# Patient Record
Sex: Female | Born: 1995 | Race: White | Hispanic: No | Marital: Single | Smoking: Never smoker
Health system: Southern US, Community
[De-identification: ages and names within clinical notes are randomized; demographics above are authoritative.]

## PROBLEM LIST (undated history)

## (undated) DIAGNOSIS — N946 Dysmenorrhea, unspecified: Secondary | ICD-10-CM

## (undated) DIAGNOSIS — D682 Hereditary deficiency of other clotting factors: Secondary | ICD-10-CM

## (undated) DIAGNOSIS — L708 Other acne: Secondary | ICD-10-CM

## (undated) DIAGNOSIS — F329 Major depressive disorder, single episode, unspecified: Secondary | ICD-10-CM

## (undated) DIAGNOSIS — I824Y9 Acute embolism and thrombosis of unspecified deep veins of unspecified proximal lower extremity: Secondary | ICD-10-CM

## (undated) DIAGNOSIS — F32A Depression, unspecified: Secondary | ICD-10-CM

## (undated) DIAGNOSIS — N83209 Unspecified ovarian cyst, unspecified side: Secondary | ICD-10-CM

## (undated) DIAGNOSIS — D649 Anemia, unspecified: Secondary | ICD-10-CM

## (undated) HISTORY — DX: Depression, unspecified: F32.A

## (undated) HISTORY — DX: Unspecified ovarian cyst, unspecified side: N83.209

## (undated) HISTORY — DX: Acute embolism and thrombosis of unspecified deep veins of unspecified proximal lower extremity: I82.4Y9

## (undated) HISTORY — PX: TONSILLECTOMY: SUR1361

## (undated) HISTORY — DX: Dysmenorrhea, unspecified: N94.6

## (undated) HISTORY — DX: Anemia, unspecified: D64.9

## (undated) HISTORY — DX: Other acne: L70.8

## (undated) HISTORY — DX: Major depressive disorder, single episode, unspecified: F32.9

---
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
## 2013-01-04 ENCOUNTER — Ambulatory Visit: Payer: BC Managed Care – PPO | Admitting: Family

## 2013-01-04 DIAGNOSIS — I824Y9 Acute embolism and thrombosis of unspecified deep veins of unspecified proximal lower extremity: Secondary | ICD-10-CM

## 2013-01-07 ENCOUNTER — Telehealth: Payer: Self-pay | Admitting: Oncology

## 2013-01-07 NOTE — Telephone Encounter (Signed)
LVOM for pt to return call.  °

## 2013-01-08 ENCOUNTER — Encounter: Payer: BC Managed Care – PPO | Admitting: Family

## 2013-01-08 ENCOUNTER — Ambulatory Visit: Payer: BC Managed Care – PPO

## 2013-01-29 ENCOUNTER — Telehealth: Payer: Self-pay | Admitting: Family

## 2013-01-29 NOTE — Telephone Encounter (Signed)
Needs OV in the next week

## 2013-01-29 NOTE — Telephone Encounter (Signed)
Needs an OV within the next 1 week

## 2013-01-29 NOTE — Telephone Encounter (Signed)
Left message to advise pt to call office to schedule pt/inr appt

## 2013-01-31 ENCOUNTER — Ambulatory Visit (INDEPENDENT_AMBULATORY_CARE_PROVIDER_SITE_OTHER): Payer: BC Managed Care – PPO | Admitting: Family

## 2013-01-31 DIAGNOSIS — I824Y9 Acute embolism and thrombosis of unspecified deep veins of unspecified proximal lower extremity: Secondary | ICD-10-CM

## 2013-01-31 LAB — POCT INR: INR: 2.6

## 2013-01-31 MED ORDER — WARFARIN SODIUM 7.5 MG PO TABS: 7.5000 mg | ORAL_TABLET | Freq: Every day | ORAL | Status: DC

## 2013-01-31 NOTE — Patient Instructions (Signed)
Continue 1 1/2 tabs of Coumadin a day EXCEPT 1 tab on Sat and Sunday.   Anticoagulation Dose Instructions as of 01/31/2013     Belinda Long Tue Wed Thu Fri Sat   New Dose 7.5 mg 11.25 mg 11.25 mg 11.25 mg 11.25 mg 11.25 mg 7.5 mg    Description       Continue 1 1/2 tabs of Coumadin a day EXCEPT 1 tab on Sat and Sunday.

## 2013-02-04 ENCOUNTER — Other Ambulatory Visit: Payer: Self-pay

## 2013-02-04 MED ORDER — WARFARIN SODIUM 7.5 MG PO TABS: ORAL_TABLET | ORAL | Status: DC

## 2013-02-05 ENCOUNTER — Telehealth: Payer: Self-pay | Admitting: Family

## 2013-02-05 MED ORDER — BUPROPION HCL ER (XL) 300 MG PO TB24: 300.0000 mg | ORAL_TABLET | Freq: Every day | ORAL | Status: DC

## 2013-02-05 NOTE — Telephone Encounter (Signed)
Instructions clarified and generic ok to fill.  Wellbutrin also filled per mom's request

## 2013-02-05 NOTE — Telephone Encounter (Signed)
Pharm called for clarification on warfarin (COUMADIN) 7.5 MG tablet. Pt has been getting generic, pt script states "dispense as written". That means name brand. Is it OK to fill generic?  Pls call back [facility] : [id number]  Ref no [id number]

## 2013-02-22 ENCOUNTER — Ambulatory Visit (INDEPENDENT_AMBULATORY_CARE_PROVIDER_SITE_OTHER): Payer: BC Managed Care – PPO | Admitting: Internal Medicine

## 2013-02-22 ENCOUNTER — Encounter: Payer: Self-pay | Admitting: Internal Medicine

## 2013-02-22 VITALS — BP 108/72 | HR 86 | Temp 99.1°F | Wt 137.0 lb

## 2013-02-22 DIAGNOSIS — R590 Localized enlarged lymph nodes: Secondary | ICD-10-CM | POA: Insufficient documentation

## 2013-02-22 DIAGNOSIS — R599 Enlarged lymph nodes, unspecified: Secondary | ICD-10-CM

## 2013-02-22 NOTE — Patient Instructions (Signed)
This appears to be a reactive lymph gland that is not concerning. You had a pimple-like bump on your scalp.   Would use warm compresses  antibiotic ointment that a couple times a day. The lymph gland could get a little bit bigger before it gets smaller   Contact us if not improving in another week or 2 or getting more areas of concern

## 2013-02-22 NOTE — Progress Notes (Signed)
Chief Complaint  Patient presents with  . Lump behind left ear  . Headache    HPI: Patient comes in today for SDA for  new problem evaluation.  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 She's noticed a little bit of fatigue and a couple days of headaches that she feels probably just related to her college situation. However she noted over the last day of mildly tender lump behind her left ear her mom wanted it checked out. She has no fever sore throat cough systemic symptoms with this. There is a bump on her scalp.  She is currently on Accutane now for acne and does have dry skin patches and fissuring.  ROS: See pertinent positives and negatives per HPI. Feels it mood is doing well current medications and situation  Past Medical History  Diagnosis Date  . Acne   . Dysmenorrhea in the adolescent   . DVT (deep venous thrombosis)   . Factor V deficiency   . 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 DVT, lower extremity, proximal, acute 06/07/2012    No family history on file.  History   Social History  . Marital Status: Single    Spouse Name: N/A    Number of Children: N/A  . Years of Education: N/A   Social History Main Topics  . Smoking status: Never Smoker   . Smokeless tobacco: None  . Alcohol Use: No  . Drug Use: No  . Sexually Active: Yes    Birth Control/ Protection: None   Other Topics Concern  . None   Social History Narrative   Plays basketball and volleyball   [employer] going into 12th grade grades are good   HH of 3   3 dogs    Outpatient Encounter Prescriptions as of 02/22/2013  Medication Sig Dispense Refill  . buPROPion (WELLBUTRIN XL) 300 MG 24 hr tablet Take 1 tablet (300 mg total) by mouth daily.  90 tablet  0  . ibuprofen (ADVIL) 200 MG tablet Take 200 mg by mouth every 6 (six) hours as needed.      . ISOtretinoin (ACCUTANE) 30 MG capsule Take 30 mg by mouth 2 (two) times daily.       Marland Kitchen warfarin (COUMADIN) 7.5 MG tablet Take 1 1/2 tablets daily, except Friday and Saturday take 1 tablet  180 tablet  0  . [DISCONTINUED] MYORISAN 40 MG capsule        No facility-administered encounter medications on file as of 02/22/2013.    EXAM:  BP 108/72  Pulse 86  Temp(Src) 99.1 F (37.3 C) (Oral)  Wt 137 lb (62.143 kg)  BMI 21.45 kg/m2  SpO2 99%  LMP 01/25/2013  Body mass index is 21.45 kg/(m^2).  GENERAL: vitals reviewed and listed above, alert, oriented, appears well hydrated and in no acute distress  HEENT: atraumatic, conjunctiva  clear, no obvious abnormalities on inspection of external nose and ears lips are a bit dry and cracked skin is clear TMs are intact EACs are normal. There is some mild dry patches behind the ears.  There is a or centimeter red mildly tender bump with central excoriation on the left parietal scalp OP : no lesion edema or exudate   NECK: no obvious masses on inspection palpation no adenopathy  LUNGS:  No respiratory symptoms  CV: HRRR,   MS: moves all extremities normal gait  PSYCH: pleasant and  cooperative, no obvious depression or anxiety  ASSESSMENT AND PLAN:  Discussed the following assessment and plan:  Reactive l retroauricular  lymphadenopathy This appears to be benign reactive to the red area on the scalp local care expectant management at this time no oral antibiotic followup if worsening. -Patient advised to return or notify health care team  if symptoms worsen or persist or new concerns arise.  Patient Instructions  This appears to be a reactive lymph gland that is not concerning. You had a pimple-like bump on your scalp.   Would use warm compresses  antibiotic ointment that a couple times a day. The lymph gland could get a little bit bigger before it gets smaller   Contact us if not improving in another week or 2 or getting more areas of concern      Burna Mortimer K. Panosh M.D.

## 2013-03-01 ENCOUNTER — Encounter: Payer: BC Managed Care – PPO | Admitting: Family

## 2013-03-04 ENCOUNTER — Ambulatory Visit (INDEPENDENT_AMBULATORY_CARE_PROVIDER_SITE_OTHER): Payer: BC Managed Care – PPO | Admitting: Family

## 2013-03-04 DIAGNOSIS — I824Y9 Acute embolism and thrombosis of unspecified deep veins of unspecified proximal lower extremity: Secondary | ICD-10-CM

## 2013-03-04 LAB — POCT INR: INR: 1.3

## 2013-03-04 NOTE — Patient Instructions (Addendum)
Take an extra 1/2 tab today and tomorrow. Continue 1 1/2 tabs of Coumadin a day EXCEPT 1 tab on Sat and Sunday.   Anticoagulation Dose Instructions as of 03/04/2013     Belinda Long Tue Wed Thu Fri Sat   New Dose 7.5 mg 11.25 mg 11.25 mg 11.25 mg 11.25 mg 11.25 mg 7.5 mg    Description       Take an extra 1/2 tab today and tomorrow. Continue 1 1/2 tabs of Coumadin a day EXCEPT 1 tab on Sat and Sunday.

## 2013-03-18 ENCOUNTER — Encounter: Payer: Self-pay | Admitting: Internal Medicine

## 2013-03-18 ENCOUNTER — Ambulatory Visit (INDEPENDENT_AMBULATORY_CARE_PROVIDER_SITE_OTHER): Payer: BC Managed Care – PPO | Admitting: Internal Medicine

## 2013-03-18 VITALS — BP 112/78 | HR 84 | Temp 98.4°F | Wt 133.0 lb

## 2013-03-18 DIAGNOSIS — M79609 Pain in unspecified limb: Secondary | ICD-10-CM

## 2013-03-18 DIAGNOSIS — K130 Diseases of lips: Secondary | ICD-10-CM

## 2013-03-18 DIAGNOSIS — L0889 Other specified local infections of the skin and subcutaneous tissue: Secondary | ICD-10-CM

## 2013-03-18 DIAGNOSIS — M79605 Pain in left leg: Secondary | ICD-10-CM

## 2013-03-18 DIAGNOSIS — Z79899 Other long term (current) drug therapy: Secondary | ICD-10-CM

## 2013-03-18 MED ORDER — MUPIROCIN 2 % EX OINT: TOPICAL_OINTMENT | Freq: Three times a day (TID) | CUTANEOUS | Status: DC

## 2013-03-18 NOTE — Progress Notes (Signed)
Chief Complaint  Patient presents with  . Chapped lips    HPI: Patient comes in today for SDA for  new problem evaluation. Dry lips with accutane but now sore and cracked middle lower and corners  And given topical but derm but no helping and ? wat to do .   No fever mouth sores   Also mom wanted to have her report ocass lef leg pain bu t not like when had clot. No new swelling   INRs stated went low and then in range above 2 range per pt  No bleeding swelling   ROS: See pertinent positives and negatives per HPI.  Past Medical History  Diagnosis Date  . Acne   . Dysmenorrhea in the adolescent   . DVT (deep venous thrombosis)   . Factor V deficiency   . 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 DVT, lower extremity, proximal, acute 06/07/2012    No family history on file.  History   Social History  . Marital Status: Single    Spouse Name: N/A    Number of Children: N/A  . Years of Education: N/A   Social History Main Topics  . Smoking status: Never Smoker   . Smokeless tobacco: None  . Alcohol Use: No  . Drug Use: No  . Sexually Active: Yes    Birth Control/ Protection: None   Other Topics Concern  . None   Social History Narrative   Plays basketball and volleyball   [employer] going into 12th grade grades are good   HH of 3   3 dogs    Outpatient Encounter Prescriptions as of 03/18/2013  Medication Sig Dispense Refill  . buPROPion (WELLBUTRIN XL) 300 MG 24 hr tablet Take 1 tablet (300 mg total) by mouth daily.  90 tablet  0  . ibuprofen (ADVIL) 200 MG tablet Take 200 mg by mouth every 6 (six) hours as needed.      . ISOtretinoin (ACCUTANE) 30 MG capsule Take 30 mg by mouth 2 (two) times daily.      Marland Kitchen warfarin (COUMADIN) 7.5 MG tablet Take 1 1/2 tablets daily, except Friday and Saturday take 1 tablet  180 tablet  0  . mupirocin ointment (BACTROBAN) 2 % Apply topically 3 (three) times daily.  22 g  0    No facility-administered encounter medications on file as of 03/18/2013.    EXAM:  BP 112/78  Pulse 84  Temp(Src) 98.4 F (36.9 C) (Oral)  Wt 133 lb (60.328 kg)  BMI 20.83 kg/m2  SpO2 98%  LMP 02/21/2013  Body mass index is 20.83 kg/(m^2).  GENERAL: vitals reviewed and listed above, alert, oriented, appears well hydrated and in no acute distress  HEENT: atraumatic, conjunctiva  clear, no obvious abnormalities on inspection of external nose and ears OP : no lesion edema or exudate   lipds dry middle fissue and perleche at both corners and no vesicles or pustules or facial redness   NECK: no obvious masses on inspection palpation    CV: HRRR, no clubbing cyanosis or  peripheral edema nl cap refill   MS: moves all extremities without noticeable focal  Abnormality no gross problem with left leg  Gait ok   PSYCH: pleasant and cooperative, no obvious depression or anxiety  ASSESSMENT AND PLAN:  Discussed the following assessment and plan:  Perleche - rx with bactroban and anti yeast  prob accutane  set up  Ongoing accutane therapy  Lip fissure  Leg pain, left - doubt dvt recurrence make sure  at goal for inr and if  ongoing pain and orswelling then fu.   -Patient advised to return or notify health care team  if symptoms worsen or persist or new concerns arise.  Patient Instructions  Sometimes there can be overgrowth of bacteria and yeast in the cracks   expecially on the sisdes. These can ge helped but topical  Antibiotic such as bactroban and yeast  Such as monistat.    Twice  To 3 x per day    Try this for a week or so  Contact if not getting better.  Or your dematologist office   Use  moisurizer   frequntly also .   Neta Mends. Clarence Cogswell M.D.

## 2013-03-18 NOTE — Patient Instructions (Addendum)
Sometimes there can be overgrowth of bacteria and yeast in the cracks   expecially on the sisdes. These can ge helped but topical  Antibiotic such as bactroban and yeast  Such as monistat.    Twice  To 3 x per day    Try this for a week or so  Contact if not getting better.  Or your dematologist office   Use  moisurizer   frequntly also .

## 2013-03-21 DIAGNOSIS — K130 Diseases of lips: Secondary | ICD-10-CM | POA: Insufficient documentation

## 2013-03-25 ENCOUNTER — Ambulatory Visit (INDEPENDENT_AMBULATORY_CARE_PROVIDER_SITE_OTHER): Payer: BC Managed Care – PPO | Admitting: Family

## 2013-03-25 DIAGNOSIS — I824Y9 Acute embolism and thrombosis of unspecified deep veins of unspecified proximal lower extremity: Secondary | ICD-10-CM

## 2013-03-25 LAB — PROTIME-INR: INR: 3 — AB (ref <=1.1)

## 2013-03-25 NOTE — Patient Instructions (Addendum)
Continue 1 1/2 tabs of Coumadin a day EXCEPT 1 tab on Sat and Sunday  Anticoagulation Dose Instructions as of 03/25/2013     Belinda Long Tue Wed Thu Fri Sat   New Dose 7.5 mg 11.25 mg 11.25 mg 11.25 mg 11.25 mg 11.25 mg 7.5 mg    Description       Continue 1 1/2 tabs of Coumadin a day EXCEPT 1 tab on Sat and Sunday.

## 2013-04-18 IMAGING — CT CT ANGIO CHEST
2 of 6 series · 19 of 36 positions shown · IV contrast (OMNIPAQUE)
Comparison: No priors.

CLINICAL DATA: A large deep venous thrombosis in left leg extending
into the distal IVC.

CT ANGIOGRAPHY CHEST
TECHNIQUE: Multidetector CT imaging of the chest using the
standard protocol during bolus administration of intravenous
contrast. Multiplanar reconstructed images including MIPs were
obtained and reviewed to evaluate the vascular anatomy.
Contrast: 100mL OMNIPAQUE IOHEXOL 350 MG/ML SOLN

[Series 6: pe thins @ 1mm · axial · 0.75mm/px · z∈[-250,+0]mm · 18 of 278 slices shown]
[im 14/278  lung]
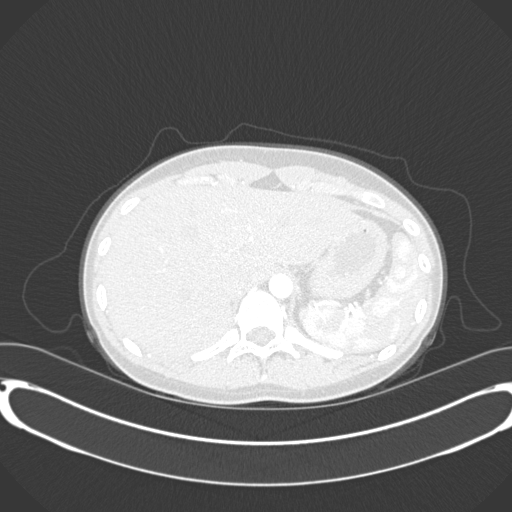
[im 28/278  mediastinal]
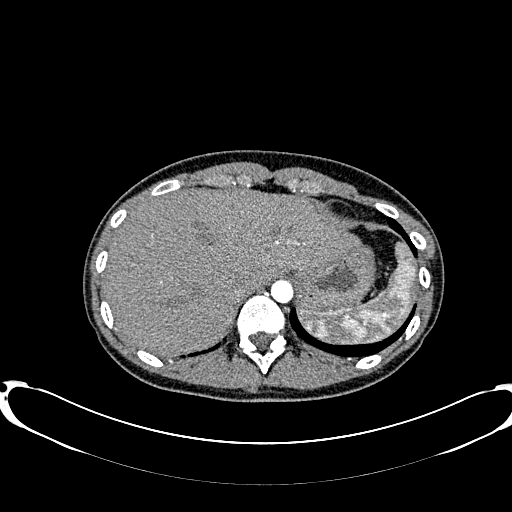
[im 42/278  lung]
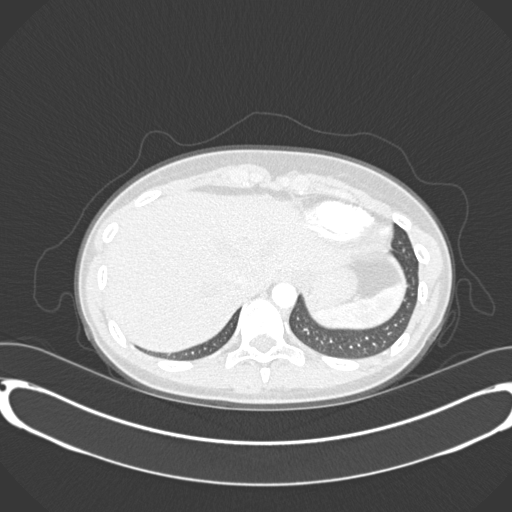
[im 56/278  mediastinal]
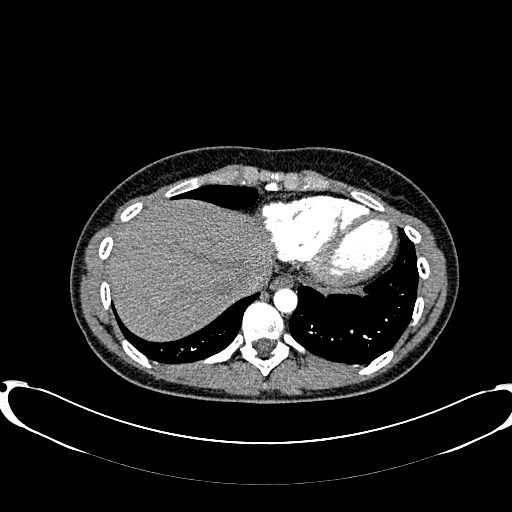
[im 70/278  lung]
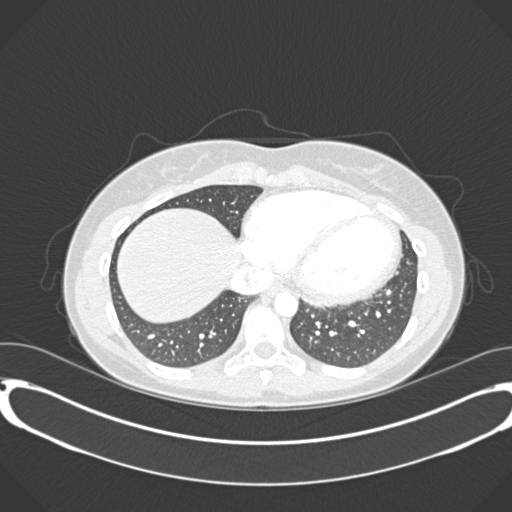
[im 84/278  mediastinal]
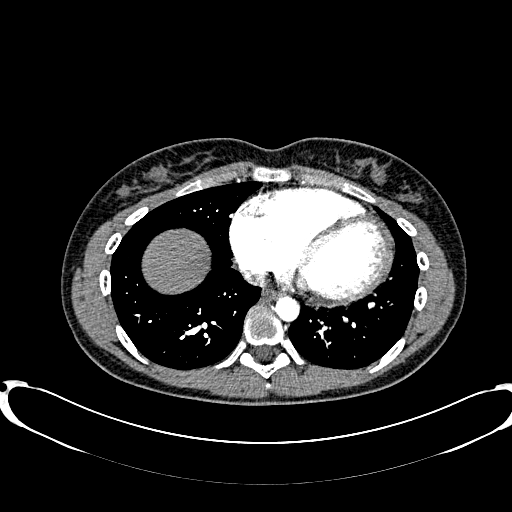
[im 97/278  lung]
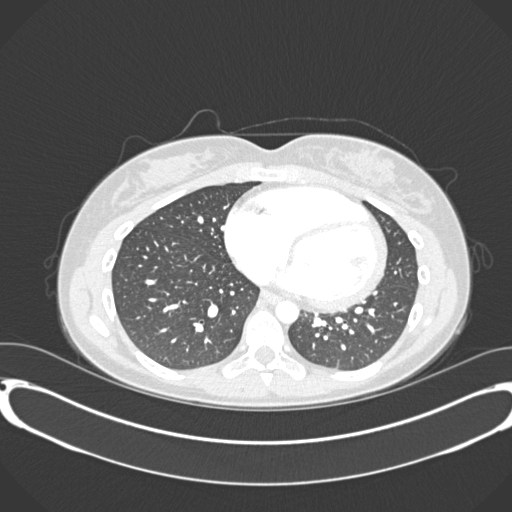
[im 111/278  mediastinal]
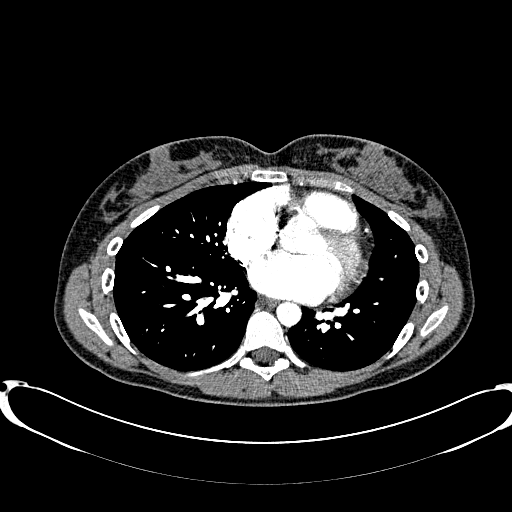
[im 125/278  lung]
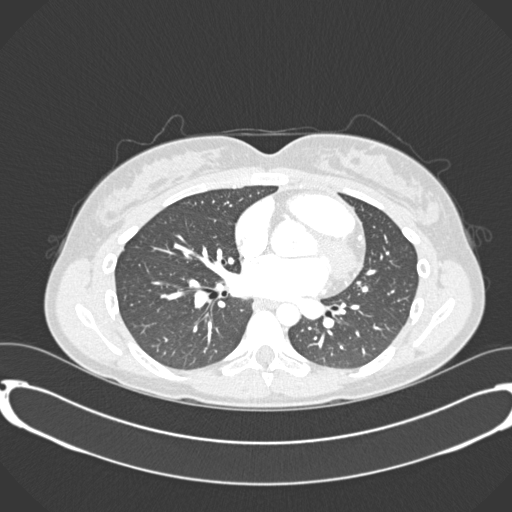
[im 153/278  mediastinal]
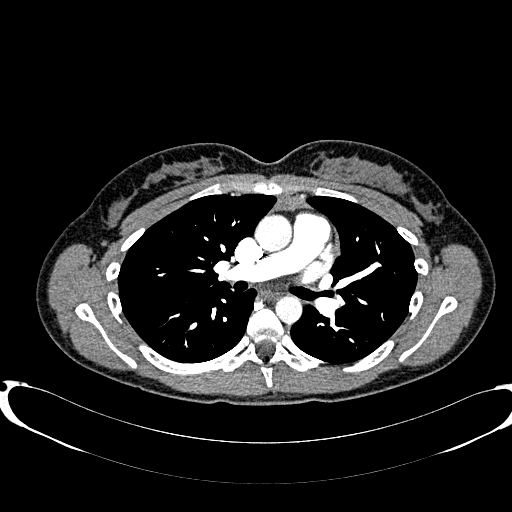
[im 167/278  lung]
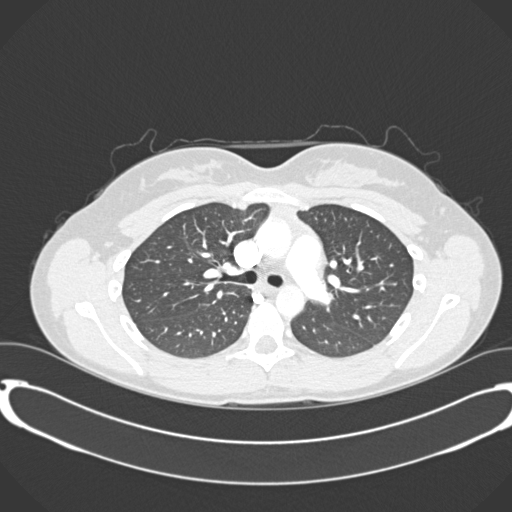
[im 181/278  mediastinal]
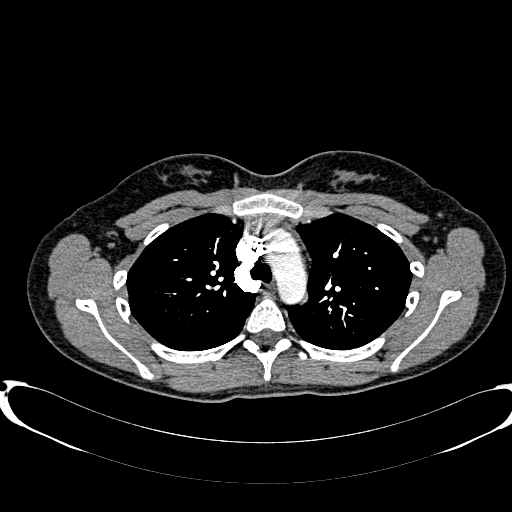
[im 194/278  lung]
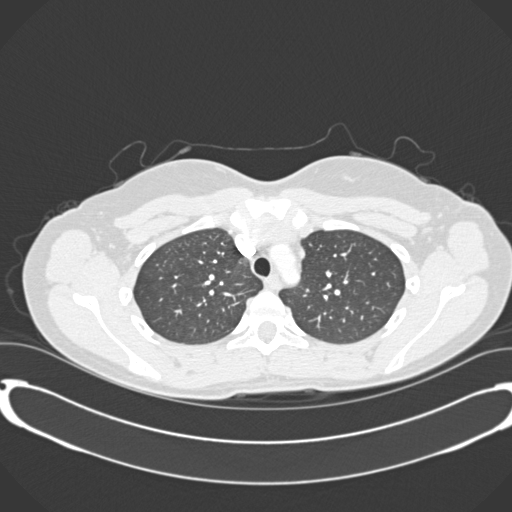
[im 208/278  mediastinal]
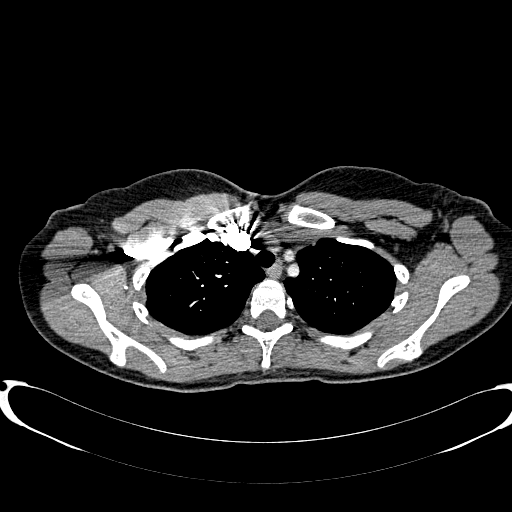
[im 222/278  lung]
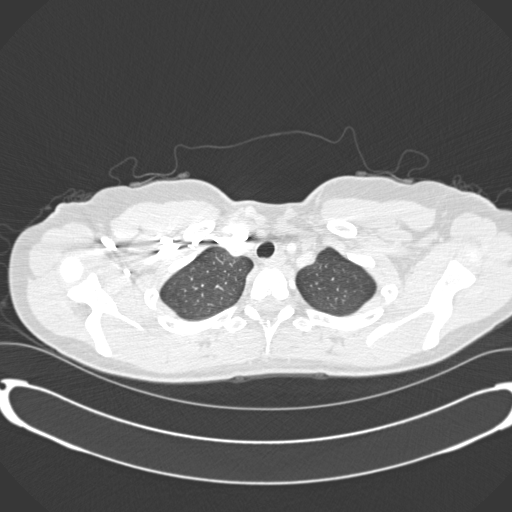
[im 236/278  mediastinal]
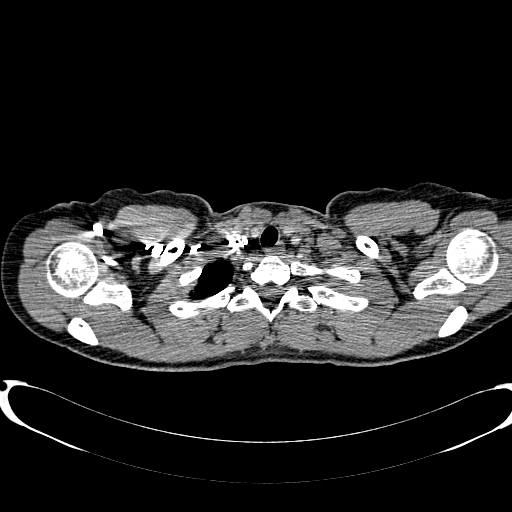
[im 250/278  lung]
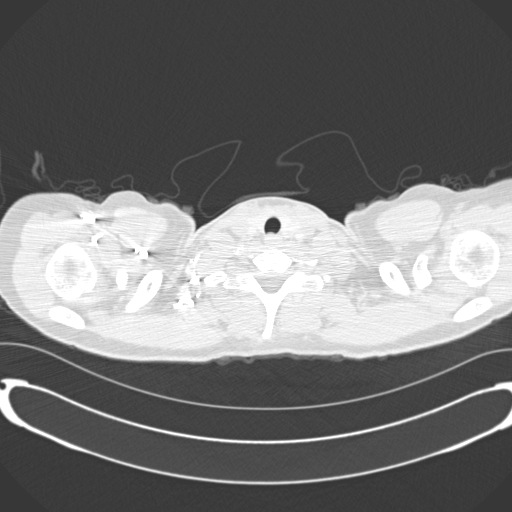
[im 264/278  mediastinal]
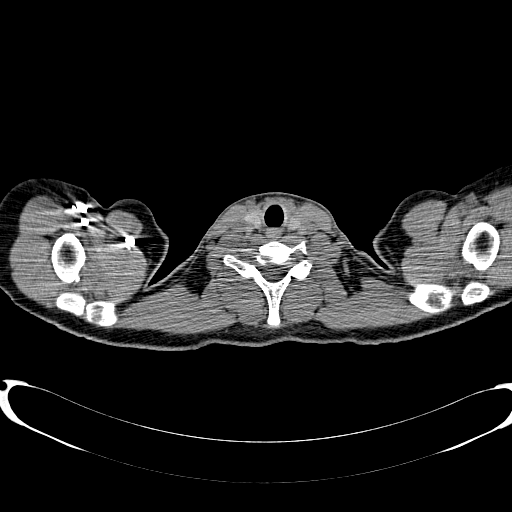

[Series 602: coronal mpr · coronal · 0.75mm/px · 1 of 96 slices shown]
[im 48/96  mediastinal]
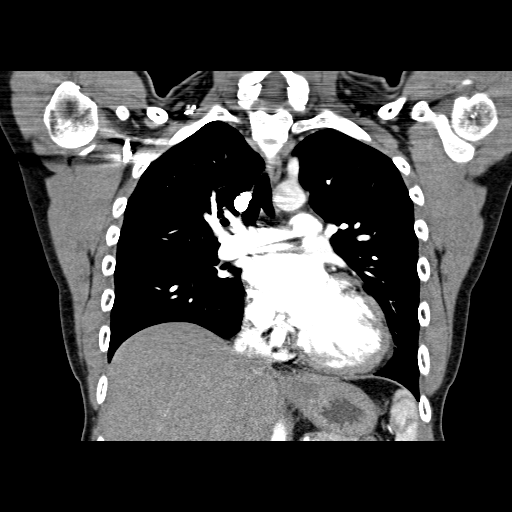

[19 of 36 positions shown; findings below may reference images not displayed]

FINDINGS: Mediastinum: No filling defects within the pulmonary arterial tree
to suggest underlying pulmonary embolism. Heart size is mildly
enlarged. There is no significant pericardial fluid, thickening or
pericardial calcification. No pathologically enlarged mediastinal
or hilar lymph nodes. Esophagus is unremarkable in appearance.
Tiny amount of soft tissue in the anterior mediastinum is most
consistent with residual thymic tissue in this young female
patient.

Lungs/Pleura: No acute consolidative airspace disease.  No pleural
effusions.  No suspicious appearing pulmonary nodules or masses.

Upper Abdomen: Visualized portions of the upper abdomen are
unremarkable.

Musculoskeletal: There are no aggressive appearing lytic or blastic
lesions noted in the visualized portions of the skeleton.
IMPRESSION: 1.  No evidence of pulmonary embolism.
2.  No acute findings in the thorax.

## 2013-04-22 ENCOUNTER — Ambulatory Visit (INDEPENDENT_AMBULATORY_CARE_PROVIDER_SITE_OTHER): Payer: BC Managed Care – PPO | Admitting: Family

## 2013-04-22 DIAGNOSIS — I824Y9 Acute embolism and thrombosis of unspecified deep veins of unspecified proximal lower extremity: Secondary | ICD-10-CM

## 2013-04-22 LAB — POCT INR: INR: 1.6
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
## 2013-05-08 ENCOUNTER — Encounter: Payer: BC Managed Care – PPO | Admitting: Family

## 2013-05-28 ENCOUNTER — Telehealth: Payer: Self-pay | Admitting: Family

## 2013-05-28 NOTE — Telephone Encounter (Signed)
Needs an INR checked.  

## 2013-05-28 NOTE — Telephone Encounter (Signed)
Left message for pt to call office an schedule pt/inr

## 2013-06-04 ENCOUNTER — Encounter: Payer: BC Managed Care – PPO | Admitting: Family

## 2013-06-04 DIAGNOSIS — Z0289 Encounter for other administrative examinations: Secondary | ICD-10-CM

## 2013-06-07 ENCOUNTER — Ambulatory Visit: Payer: Self-pay | Admitting: Family

## 2013-06-07 ENCOUNTER — Other Ambulatory Visit: Payer: Self-pay | Admitting: Family

## 2013-06-07 ENCOUNTER — Telehealth: Payer: Self-pay | Admitting: Internal Medicine

## 2013-06-07 ENCOUNTER — Ambulatory Visit (INDEPENDENT_AMBULATORY_CARE_PROVIDER_SITE_OTHER): Payer: BC Managed Care – PPO | Admitting: Family

## 2013-06-07 DIAGNOSIS — I824Y9 Acute embolism and thrombosis of unspecified deep veins of unspecified proximal lower extremity: Secondary | ICD-10-CM

## 2013-06-07 DIAGNOSIS — Z79899 Other long term (current) drug therapy: Secondary | ICD-10-CM

## 2013-06-07 LAB — POCT INR: INR: 3.7

## 2013-06-07 MED ORDER — CEPHALEXIN 500 MG PO TABS: 500.0000 mg | ORAL_TABLET | Freq: Three times a day (TID) | ORAL | Status: DC

## 2013-06-07 MED ORDER — BUPROPION HCL ER (XL) 450 MG PO TB24: 450.0000 mg | ORAL_TABLET | Freq: Every day | ORAL | Status: DC

## 2013-06-07 NOTE — Telephone Encounter (Signed)
Seen in office today 05/28/2013.  Getting prescription refill for Wellbutrin and it was sent mail order [facility] in [place].  Only has one pill left - needs 1-2 week supply called to local [facility] in [place] [phone]   so she will have mediation to take until mail order Rx arrives. If questions, Mom can be reached at  [phone].

## 2013-06-07 NOTE — Patient Instructions (Addendum)
Hold Coumadin today.  Then continue 1 1/2 tabs of Coumadin a day EXCEPT 1 tab on Sat and Sunday.   Anticoagulation Dose Instructions as of 06/07/2013     Belinda Long Tue Wed Thu Fri Sat   New Dose 7.5 mg 11.25 mg 11.25 mg 11.25 mg 11.25 mg 11.25 mg 7.5 mg    Description       Hold Coumadin today.  Then continue 1 1/2 tabs of Coumadin a day EXCEPT 1 tab on Sat and Sunday.

## 2013-06-07 NOTE — Telephone Encounter (Signed)
Ok to send rx to [facility] per [place].

## 2013-06-07 NOTE — Patient Instructions (Signed)
Probably OK to switch to Xarelto as long as renal function OK. DSM ----- Message ----- From: Baker Pierini, FNP Sent: 06/07/2013 11:16 AM To: Samul Dada, MD Dr. 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 After speaking to Dr. Fabian Sharp today, she want to again consider Xarelto as an option. We are aware that it has not been studied in Megans case. But the concern is that she is at an even higher risk being labile, as she would be considering a newer agent. What are your thoughts? [employer]

## 2013-06-10 NOTE — Telephone Encounter (Signed)
Mom states RX for wellbutrin was not at local pharmacy.  Can you resend? 1-2 week supply because pt is out to [place], [place]

## 2013-07-08 ENCOUNTER — Ambulatory Visit: Payer: BC Managed Care – PPO | Admitting: Family

## 2013-07-10 ENCOUNTER — Ambulatory Visit: Payer: BC Managed Care – PPO | Admitting: Family

## 2013-07-18 ENCOUNTER — Ambulatory Visit (INDEPENDENT_AMBULATORY_CARE_PROVIDER_SITE_OTHER): Payer: BC Managed Care – PPO | Admitting: Family

## 2013-07-18 ENCOUNTER — Encounter: Payer: Self-pay | Admitting: Family

## 2013-07-18 ENCOUNTER — Ambulatory Visit: Payer: Self-pay | Admitting: General Practice

## 2013-07-18 VITALS — BP 104/62 | HR 63 | Wt 130.0 lb

## 2013-07-18 DIAGNOSIS — I82409 Acute embolism and thrombosis of unspecified deep veins of unspecified lower extremity: Secondary | ICD-10-CM

## 2013-07-18 DIAGNOSIS — I82402 Acute embolism and thrombosis of unspecified deep veins of left lower extremity: Secondary | ICD-10-CM

## 2013-07-18 DIAGNOSIS — Z79899 Other long term (current) drug therapy: Secondary | ICD-10-CM

## 2013-07-18 LAB — COMPREHENSIVE METABOLIC PANEL
CO2: 27 mEq/L (ref 19–32)
Creatinine, Ser: 1.2 mg/dL (ref 0.4–1.2)
GFR: 63.16 mL/min (ref >60.00)
Glucose, Bld: 79 mg/dL (ref 70–99)
Total Bilirubin: 0.6 mg/dL (ref 0.3–1.2)

## 2013-07-18 LAB — POCT INR: INR: 1

## 2013-07-18 MED ORDER — RIVAROXABAN 20 MG PO TABS: 20.0000 mg | ORAL_TABLET | Freq: Every day | ORAL | Status: DC

## 2013-07-18 NOTE — Progress Notes (Signed)
  Subjective:    Patient ID: Belinda Long, female    DOB: 05-Jun-1996, 17 y.o.   MRN: [id number]  HPI 17 year old white female, patient of Dr. Fabian Sharp, is in today to discuss discontinuing Coumadin and taking Xarelto. In the past she had great difficulty with INR management. Therefore, I have discussed with hematology this is probably a better candidate for one of the newer agents. They have agreed. She has been off Coumadin 2 week.   Review of Systems  Constitutional: Negative.   HENT: Negative.   Respiratory: Negative.   Cardiovascular: Negative.   Gastrointestinal: Negative.   Genitourinary: Negative.   Musculoskeletal: Negative.   Skin: Negative.   Neurological: Negative.   Psychiatric/Behavioral: Negative.    Past Medical History  Diagnosis Date  . Acne   . Dysmenorrhea in the adolescent   . DVT (deep venous thrombosis)   . Factor V deficiency   . 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 DVT, lower extremity, proximal, acute 06/07/2012    History   Social History  . Marital Status: Single    Spouse Name: N/A    Number of Children: N/A  . Years of Education: N/A   Occupational History  . Not on file.   Social History Main Topics  . Smoking status: Never Smoker   . Smokeless tobacco: Not on file  . Alcohol Use: No  . Drug Use: No  . Sexually Active: Yes    Birth Control/ Protection: None   Other Topics Concern  . Not on file   Social History Narrative   Plays basketball and volleyball   [employer] going into 12th grade grades are good   HH of 3   3 dogs    Past Surgical History  Procedure Laterality Date  . Tonsillectomy    . Tonsillectomy      No family history on file.  No Known Allergies  Current Outpatient Prescriptions on File Prior to Visit  Medication Sig Dispense Refill  . buPROPion 450 MG TB24 Take 450 mg by mouth daily.  90 tablet  0  . ISOtretinoin (ACCUTANE) 30 MG capsule  Take 30 mg by mouth 2 (two) times daily.      . mupirocin ointment (BACTROBAN) 2 % Apply topically 3 (three) times daily.  22 g  0  . BuPROPion HCl ER, XL, 450 MG TB24 Take 450 mg by mouth daily.  30 tablet  0  . Cephalexin 500 MG tablet Take 1 tablet (500 mg total) by mouth 3 (three) times daily.  21 tablet  0   No current facility-administered medications on file prior to visit.    BP 104/62  Pulse 63  Wt 130 lb (58.968 kg)  BMI 20.36 kg/m2  SpO2 98%chart    Objective:   Physical Exam  Constitutional: She appears well-developed and well-nourished.  Neck: Normal range of motion. Neck supple. No thyromegaly present.  Cardiovascular: Normal rate, regular rhythm and normal heart sounds.   Pulmonary/Chest: Effort normal and breath sounds normal.  Neurological: She is alert.  Skin: Skin is warm and dry.          Assessment & Plan:  Assessment: 1. Deep vein thrombosis 2. High-risk medication usage  Plan: Start Xarelto 20 mg once daily. Encouraged medication compliance. CMP sent. Recheck in 3 weeks to be sure and kidneys are functioning well. Call any questions or concerns.

## 2013-09-05 ENCOUNTER — Ambulatory Visit (INDEPENDENT_AMBULATORY_CARE_PROVIDER_SITE_OTHER): Payer: BC Managed Care – PPO | Admitting: Family

## 2013-09-05 ENCOUNTER — Encounter: Payer: Self-pay | Admitting: Family

## 2013-09-05 VITALS — BP 96/60 | HR 63

## 2013-09-05 DIAGNOSIS — F3289 Other specified depressive episodes: Secondary | ICD-10-CM

## 2013-09-05 DIAGNOSIS — F329 Major depressive disorder, single episode, unspecified: Secondary | ICD-10-CM

## 2013-09-05 DIAGNOSIS — I82402 Acute embolism and thrombosis of unspecified deep veins of left lower extremity: Secondary | ICD-10-CM

## 2013-09-05 DIAGNOSIS — Z23 Encounter for immunization: Secondary | ICD-10-CM

## 2013-09-05 DIAGNOSIS — I82409 Acute embolism and thrombosis of unspecified deep veins of unspecified lower extremity: Secondary | ICD-10-CM

## 2013-09-05 DIAGNOSIS — Z79899 Other long term (current) drug therapy: Secondary | ICD-10-CM

## 2013-09-05 LAB — COMPREHENSIVE METABOLIC PANEL
ALT: 16 U/L (ref 0–35)
Albumin: 4.6 g/dL (ref 3.5–5.2)
CO2: 29 mEq/L (ref 19–32)
Calcium: 9.1 mg/dL (ref 8.4–10.5)
Chloride: 106 mEq/L (ref 96–112)
GFR: 69.18 mL/min (ref >60.00)
Sodium: 141 mEq/L (ref 135–145)
Total Protein: 7.1 g/dL (ref 6.0–8.3)

## 2013-09-05 MED ORDER — BUPROPION HCL ER (XL) 300 MG PO TB24: 300.0000 mg | ORAL_TABLET | Freq: Every day | ORAL | Status: DC

## 2013-09-05 MED ORDER — RIVAROXABAN 20 MG PO TABS: 20.0000 mg | ORAL_TABLET | Freq: Every day | ORAL | Status: DC

## 2013-09-05 NOTE — Progress Notes (Signed)
Subjective:    Patient ID: Belinda Long, female    DOB: [birth year], 17 y.o.   MRN: [id number]  HPI  17 year old white female, nonsmoker is in for a medication followup. She has a history of factor V Leiden mutation and subsequently had a DVT. She's been on chronic anticoagulation therapy. Currently on Xarelto and tolerating it well.   She is requesting a refll on Wellbutrin XL. She is currently on 450 m but requesting to be back on 300mg  due to feeling jittery. Overall meds work well.    Review of Systems  Constitutional: Negative.   HENT: Negative.   Respiratory: Negative.   Cardiovascular: Negative.   Gastrointestinal: Negative.   Endocrine: Negative.   Genitourinary: Negative.   Musculoskeletal: Negative.   Skin: Negative.   Allergic/Immunologic: Negative.   Neurological: Negative.   Hematological: Negative.   Psychiatric/Behavioral: Negative.    Past Medical History  Diagnosis Date  . Acne   . Dysmenorrhea in the adolescent   . DVT (deep venous thrombosis)   . Factor V deficiency   . 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 DVT, lower extremity, proximal, acute 06/07/2012    History   Social History  . Marital Status: Single    Spouse Name: N/A    Number of Children: N/A  . Years of Education: N/A   Occupational History  . Not on file.   Social History Main Topics  . Smoking status: Never Smoker   . Smokeless tobacco: Not on file  . Alcohol Use: No  . Drug Use: No  . Sexual Activity: Yes    Birth Control/ Protection: None   Other Topics Concern  . Not on file   Social History Narrative   Plays basketball and volleyball   [employer] going into 12th grade grades are good   HH of 3   3 dogs    Past Surgical History  Procedure Laterality Date  . Tonsillectomy    . Tonsillectomy      No family history on file.  No Known Allergies  Current Outpatient Prescriptions on File Prior to  Visit  Medication Sig Dispense Refill  . Cephalexin 500 MG tablet Take 1 tablet (500 mg total) by mouth 3 (three) times daily.  21 tablet  0  . ISOtretinoin (ACCUTANE) 30 MG capsule Take 30 mg by mouth 2 (two) times daily.      . mupirocin ointment (BACTROBAN) 2 % Apply topically 3 (three) times daily.  22 g  0   No current facility-administered medications on file prior to visit.    BP 96/60  Pulse 63  SpO2 98%chart    Objective:   Physical Exam  Constitutional: She is oriented to person, place, and time. She appears well-developed and well-nourished.  Neck: Normal range of motion. Neck supple.  Cardiovascular: Normal rate, regular rhythm and normal heart sounds.   Pulmonary/Chest: Effort normal and breath sounds normal.  Abdominal: Soft. Bowel sounds are normal.  Musculoskeletal: Normal range of motion.  Neurological: She is alert and oriented to person, place, and time.  Skin: Skin is warm and dry.  Psychiatric: She has a normal mood and affect.          Assessment & Plan:  Assessment: 1. DVT 2. Factor V Leiden mutation 3. High Risk medication usage 4. Depression  Plan: Kidney function sent. Will notify patient of results. Continue current medications on a  daily basis. Recheck as scheduled, and as needed. Renewed Wellbutrin.

## 2013-09-20 ENCOUNTER — Encounter: Payer: Self-pay | Admitting: Family

## 2013-09-20 ENCOUNTER — Ambulatory Visit (INDEPENDENT_AMBULATORY_CARE_PROVIDER_SITE_OTHER): Payer: BC Managed Care – PPO | Admitting: Family

## 2013-09-20 VITALS — HR 78 | Wt 136.0 lb

## 2013-09-20 DIAGNOSIS — F411 Generalized anxiety disorder: Secondary | ICD-10-CM

## 2013-09-20 DIAGNOSIS — F329 Major depressive disorder, single episode, unspecified: Secondary | ICD-10-CM

## 2013-09-20 MED ORDER — DULOXETINE HCL 60 MG PO CPEP
60.0000 mg | ORAL_CAPSULE | Freq: Every day | ORAL | Status: DC
Start: 2013-09-20 — End: 2014-02-21

## 2013-09-20 NOTE — Progress Notes (Signed)
  Subjective:    Patient ID: Belinda Long, female    DOB: 31-Oct-1996, 17 y.o.   MRN: [id number]  HPI 17 year old WF, nonsmoker is in today with c/o increased anxiety, depression, decreased concentration despite taking Wellbutrin. She has been on 300mg  daily for several months. Denies helplessness, hopelessness, thought of death or dying.     Review of Systems  Constitutional: Negative.   Respiratory: Negative.   Cardiovascular: Negative.   Skin: Negative.   Neurological: Negative.   Psychiatric/Behavioral: Positive for sleep disturbance, decreased concentration and agitation. The patient is nervous/anxious.    Past Medical History  Diagnosis Date  . Acne   . Dysmenorrhea in the adolescent   . DVT (deep venous thrombosis)   . Factor V deficiency   . 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 DVT, lower extremity, proximal, acute 06/07/2012    History   Social History  . Marital Status: Single    Spouse Name: N/A    Number of Children: N/A  . Years of Education: N/A   Occupational History  . Not on file.   Social History Main Topics  . Smoking status: Never Smoker   . Smokeless tobacco: Not on file  . Alcohol Use: No  . Drug Use: No  . Sexual Activity: Yes    Birth Control/ Protection: None   Other Topics Concern  . Not on file   Social History Narrative   Plays basketball and volleyball   [employer] going into 12th grade grades are good   HH of 3   3 dogs    Past Surgical History  Procedure Laterality Date  . Tonsillectomy    . Tonsillectomy      No family history on file.  No Known Allergies  Current Outpatient Prescriptions on File Prior to Visit  Medication Sig Dispense Refill  . Cephalexin 500 MG tablet Take 1 tablet (500 mg total) by mouth 3 (three) times daily.  21 tablet  0  . ISOtretinoin (ACCUTANE) 30 MG capsule Take 30 mg by mouth 2 (two) times daily.      . mupirocin ointment  (BACTROBAN) 2 % Apply topically 3 (three) times daily.  22 g  0  . Rivaroxaban (XARELTO) 20 MG TABS tablet Take 1 tablet (20 mg total) by mouth daily.  30 tablet  4   No current facility-administered medications on file prior to visit.    Pulse 78  Wt 136 lb (61.689 kg)  BMI 21.3 kg/m2  SpO2 99%chart    Objective:   Physical Exam  Constitutional: She is oriented to person, place, and time. She appears well-developed and well-nourished.  Neck: Normal range of motion. Neck supple.  Cardiovascular: Normal rate, regular rhythm and normal heart sounds.   Pulmonary/Chest: Effort normal and breath sounds normal.  Neurological: She is alert and oriented to person, place, and time.  Skin: Skin is warm and dry.  Psychiatric: She has a normal mood and affect.          Assessment & Plan:  Assessment: 1. Depression 2. Anxiety  Plan: D/C Wellbutrin XL and start Cymbalta 60mg  once daily. Recheck in 3 weeks.

## 2013-10-11 ENCOUNTER — Ambulatory Visit: Payer: BC Managed Care – PPO | Admitting: Family

## 2013-10-11 DIAGNOSIS — Z0289 Encounter for other administrative examinations: Secondary | ICD-10-CM

## 2014-01-16 ENCOUNTER — Encounter: Payer: Self-pay | Admitting: Internal Medicine

## 2014-01-16 ENCOUNTER — Ambulatory Visit (INDEPENDENT_AMBULATORY_CARE_PROVIDER_SITE_OTHER): Payer: BC Managed Care – PPO | Admitting: Internal Medicine

## 2014-01-16 VITALS — BP 100/60 | HR 94 | Temp 97.3°F | Resp 20 | Ht 67.0 in | Wt 145.0 lb

## 2014-01-16 DIAGNOSIS — B9789 Other viral agents as the cause of diseases classified elsewhere: Principal | ICD-10-CM

## 2014-01-16 DIAGNOSIS — J069 Acute upper respiratory infection, unspecified: Secondary | ICD-10-CM

## 2014-01-16 DIAGNOSIS — Z7901 Long term (current) use of anticoagulants: Secondary | ICD-10-CM

## 2014-01-16 DIAGNOSIS — D6851 Activated protein C resistance: Secondary | ICD-10-CM

## 2014-01-16 DIAGNOSIS — J309 Allergic rhinitis, unspecified: Secondary | ICD-10-CM

## 2014-01-16 DIAGNOSIS — D6859 Other primary thrombophilia: Secondary | ICD-10-CM

## 2014-01-16 DIAGNOSIS — I82409 Acute embolism and thrombosis of unspecified deep veins of unspecified lower extremity: Secondary | ICD-10-CM

## 2014-01-16 NOTE — Progress Notes (Signed)
Subjective:    Patient ID: Belinda Long, female    DOB: 08/23/1996, 18 y.o.   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 For the past 10 days she has had hoarseness cough and sinus congestion and more recently has developed some chest congestion as well. No documented fever or productive cough  The patient has a history of extensive proximal DVT in June of 2013. This required inpatient hospitalization and thrombolytic therapy. This occurred in the setting of BCP use for acne therapy. She was initially placed on Coumadin anticoagulation and more recently  Xarelto.  The patient was found to be heterozygous for factor V Leiden deficiency and also has a positive lupus anticoagulant.  Past Medical History  Diagnosis Date  . Acne   . Dysmenorrhea in the adolescent   . DVT (deep venous thrombosis)   . Factor V deficiency   . 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 DVT, lower extremity, proximal, acute 06/07/2012    History   Social History  . Marital Status: Single    Spouse Name: N/A    Number of Children: N/A  . Years of Education: N/A   Occupational History  . Not on file.   Social History Main Topics  . Smoking status: Never Smoker   . Smokeless tobacco: Not on file  . Alcohol Use: No  . Drug Use: No  . Sexual Activity: Yes    Birth Control/ Protection: None   Other Topics Concern  . Not on file   Social History Narrative   Plays basketball and volleyball   [employer] going into 12th grade grades are good   HH of 3   3 dogs    Past Surgical History  Procedure Laterality Date  . Tonsillectomy    . Tonsillectomy      No family history on file.  No Known Allergies  Current Outpatient Prescriptions on File Prior to Visit  Medication Sig Dispense Refill  . DULoxetine (CYMBALTA) 60 MG capsule Take 1 capsule (60 mg total) by mouth daily.  30 capsule  1  .  Rivaroxaban (XARELTO) 20 MG TABS tablet Take 1 tablet (20 mg total) by mouth daily.  30 tablet  4   No current facility-administered medications on file prior to visit.    BP 100/60  Pulse 94  Temp(Src) 97.3 F (36.3 C) (Oral)  Resp 20  Ht 5\' 7"  (1.702 m)  Wt 145 lb (65.772 kg)  BMI 22.71 kg/m2  SpO2 98%  LMP 01/01/2014      Review of Systems  Constitutional: Positive for appetite change and fatigue.  HENT: Positive for postnasal drip, rhinorrhea, sinus pressure and voice change. Negative for congestion, dental problem, hearing loss, sore throat and tinnitus.   Eyes: Negative for pain, discharge and visual disturbance.  Respiratory: Positive for cough. Negative for shortness of breath.   Cardiovascular: Negative for chest pain, palpitations and leg swelling.  Gastrointestinal: Negative for nausea, vomiting, abdominal pain, diarrhea, constipation, blood in stool and abdominal distention.  Genitourinary: Negative for dysuria, urgency, frequency, hematuria, flank pain, vaginal bleeding, vaginal discharge, difficulty urinating, vaginal pain and pelvic pain.  Musculoskeletal: Negative for arthralgias, gait problem and joint swelling.  Skin: Negative for rash.  Neurological: Negative for dizziness, syncope, speech difficulty, weakness, numbness and headaches.  Hematological: Negative for adenopathy.  Psychiatric/Behavioral: Negative for behavioral problems, dysphoric  mood and agitation. The patient is not nervous/anxious.        Objective:   Physical Exam  Constitutional: She is oriented to person, place, and time. She appears well-developed and well-nourished.  HENT:  Head: Normocephalic.  Right Ear: External ear normal.  Left Ear: External ear normal.  Mild erythema of the oropharynx  Eyes: Conjunctivae and EOM are normal. Pupils are equal, round, and reactive to light.  Neck: Normal range of motion. Neck supple. No thyromegaly present.  Cardiovascular: Normal rate, regular  rhythm, normal heart sounds and intact distal pulses.   Pulmonary/Chest: Effort normal and breath sounds normal.  Abdominal: Soft. Bowel sounds are normal. She exhibits no mass. There is no tenderness.  Musculoskeletal: Normal range of motion.  Lymphadenopathy:    She has no cervical adenopathy.  Neurological: She is alert and oriented to person, place, and time.  Skin: Skin is warm and dry. No rash noted.  Psychiatric: She has a normal mood and affect. Her behavior is normal.          Assessment & Plan:     1. Viral URI with cough Treat symptomatically  2. Heterozygous factor V Leiden mutation  - Ambulatory referral to Hematology  3. Chronic anticoagulation  - Ambulatory referral to Hematology  4. DVT (deep venous thrombosis)  - Ambulatory referral to Hematology  5. ALLERGIC RHINITIS  Continue maintenance medication

## 2014-01-16 NOTE — Progress Notes (Signed)
Pre-visit discussion using our clinic review tool. No additional management support is needed unless otherwise documented below in the visit note.  

## 2014-01-16 NOTE — Patient Instructions (Addendum)
Acute bronchitis symptoms for less than 10 days are generally not helped by antibiotics.  Take over-the-counter expectorants and cough medications such as  Mucinex DM.  Call if there is no improvement in 5 to 7 days or if he developed worsening cough, fever, or new symptoms, such as shortness of breath or chest pain.  Hematology consultation as discussed

## 2014-01-17 ENCOUNTER — Ambulatory Visit: Payer: BC Managed Care – PPO | Admitting: Family

## 2014-01-27 ENCOUNTER — Encounter: Payer: Self-pay | Admitting: Obstetrics and Gynecology

## 2014-01-31 ENCOUNTER — Encounter: Payer: Self-pay | Admitting: Nurse Practitioner

## 2014-02-04 ENCOUNTER — Ambulatory Visit: Payer: BC Managed Care – PPO | Admitting: Family

## 2014-02-07 ENCOUNTER — Encounter: Payer: Self-pay | Admitting: Gynecology

## 2014-02-07 ENCOUNTER — Telehealth: Payer: Self-pay

## 2014-02-07 ENCOUNTER — Ambulatory Visit: Payer: BC Managed Care – PPO | Admitting: Family

## 2014-02-07 MED ORDER — BUPROPION HCL ER (XL) 150 MG PO TB24: 150.0000 mg | ORAL_TABLET | Freq: Every day | ORAL | Status: DC

## 2014-02-07 NOTE — Telephone Encounter (Signed)
Pt aware wellbutrin sent to pharmacy and she should start it tonight

## 2014-02-07 NOTE — Telephone Encounter (Signed)
Ok to switch 

## 2014-02-07 NOTE — Telephone Encounter (Signed)
Pt unable to make appointment due to class schedule, however she is requesting to switch back to wellbutrin from cymbalta. Appointment scheduled for 2 week f/u. Advised pt that I would forward message to Padonda to see if switch can be made. Please advise

## 2014-02-07 NOTE — Telephone Encounter (Signed)
See below, as you are her PCP

## 2014-02-07 NOTE — Telephone Encounter (Signed)
I did not prescribe the cymbalta  And havent seen her in a year  .  Need OV and more information in the interim  to figure out plan . Why does she want to switch. ?  Do not stop cymbalta suddenly  would wean to 30 mg   per day and then make a plan .   WHen can she  come for OV,?

## 2014-02-07 NOTE — Telephone Encounter (Signed)
[facility] Ok to switch?

## 2014-02-21 ENCOUNTER — Ambulatory Visit (INDEPENDENT_AMBULATORY_CARE_PROVIDER_SITE_OTHER): Payer: BC Managed Care – PPO | Admitting: Family

## 2014-02-21 ENCOUNTER — Encounter: Payer: Self-pay | Admitting: Family

## 2014-02-21 VITALS — HR 108 | Wt 142.0 lb

## 2014-02-21 DIAGNOSIS — I82409 Acute embolism and thrombosis of unspecified deep veins of unspecified lower extremity: Secondary | ICD-10-CM

## 2014-02-21 DIAGNOSIS — F32A Depression, unspecified: Secondary | ICD-10-CM

## 2014-02-21 DIAGNOSIS — F329 Major depressive disorder, single episode, unspecified: Secondary | ICD-10-CM

## 2014-02-21 DIAGNOSIS — F3289 Other specified depressive episodes: Secondary | ICD-10-CM

## 2014-02-21 LAB — RETICULOCYTES
ABS Retic: 61.5 10*3/uL (ref 19.0–186.0)
RBC.: 4.73 MIL/uL (ref 3.87–5.11)
RETIC CT PCT: 1.3 % (ref 0.4–2.3)

## 2014-02-21 MED ORDER — BUPROPION HCL ER (XL) 300 MG PO TB24
300.0000 mg | ORAL_TABLET | Freq: Every day | ORAL | Status: DC
Start: 2014-02-21 — End: 2014-04-17

## 2014-02-21 NOTE — Progress Notes (Signed)
Pre visit review using our clinic review tool, if applicable. No additional management support is needed unless otherwise documented below in the visit note. 

## 2014-02-21 NOTE — Progress Notes (Signed)
Subjective:    Patient ID: Belinda [name], female    DOB: 11/24/1996, 18 y.o.   MRN: [id number]  HPI 18 year old white female, nonsmoker is in for recheck of depression. She's currently on Wellbutrin 150 mg and was recently switched from Cymbalta. She's doing much better. Continues to have mild anxiety at times.  She has a history of DVTs that date back to 2013. She has been on an anticoagulant since with some noncompliance. She is requesting evaluation to be taken off of anticoagulant.   Review of Systems  Constitutional: Negative.   Respiratory: Negative.   Cardiovascular: Negative.   Gastrointestinal: Negative.   Genitourinary: Negative.   Musculoskeletal: Negative.   Skin: Negative.   Neurological: Negative.   Psychiatric/Behavioral: The patient is nervous/anxious.    Past Medical History  Diagnosis Date  . Acne   . Dysmenorrhea in the adolescent   . DVT (deep venous thrombosis)   . Factor V deficiency   . 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 DVT, lower extremity, proximal, acute 06/07/2012    History   Social History  . Marital Status: Single    Spouse Name: N/A    Number of Children: N/A  . Years of Education: N/A   Occupational History  . Not on file.   Social History Main Topics  . Smoking status: Never Smoker   . Smokeless tobacco: Not on file  . Alcohol Use: No  . Drug Use: No  . Sexual Activity: Yes    Birth Control/ Protection: None   Other Topics Concern  . Not on file   Social History Narrative   Plays basketball and volleyball   [employer] going into 12th grade grades are good   HH of 3   3 dogs    Past Surgical History  Procedure Laterality Date  . Tonsillectomy    . Tonsillectomy      No family history on file.  No Known Allergies  Current Outpatient Prescriptions on File Prior to Visit  Medication Sig Dispense Refill  . Rivaroxaban (XARELTO) 20 MG TABS tablet  Take 1 tablet (20 mg total) by mouth daily.  30 tablet  4   No current facility-administered medications on file prior to visit.    Pulse 108  Wt 142 lb (64.411 kg)chart    Objective:   Physical Exam  Constitutional: She is oriented to person, place, and time. She appears well-developed and well-nourished.  Cardiovascular: Normal rate, regular rhythm and normal heart sounds.   Pulmonary/Chest: Effort normal and breath sounds normal.  Neurological: She is alert and oriented to person, place, and time.  Skin: Skin is warm and dry.  Psychiatric: She has a normal mood and affect.          Assessment & Plan:  Belinda Long was seen today for no specified reason.  Diagnoses and associated orders for this visit:  Depression  DVT (deep venous thrombosis) - Lupus anticoagulant panel - D-dimer, Quantitative - CBC with Differential - CMP - Lactate Dehydrogenase - Reticulocytes - Iron and TIBC - Ambulatory referral to Hematology  Other Orders - buPROPion (WELLBUTRIN XL) 300 MG 24 hr tablet; Take 1 tablet (300 mg total) by mouth daily.     After reviewing hematology of note, advised to obtain a series of labs, CAT of the abdomen and pelvis, and a venogram prior to discontinuation of anticoagulant. I will have her followup with  hematology. However, I will obtain notes as dictated in the chart.

## 2014-02-22 LAB — IRON AND TIBC
%SAT: 34 % (ref 20–55)
Iron: 117 ug/dL (ref 42–145)
TIBC: 342 ug/dL (ref 250–470)
UIBC: 225 ug/dL (ref 125–400)

## 2014-02-22 LAB — D-DIMER, QUANTITATIVE (NOT AT ARMC): D DIMER QUANT: 0.27 ug{FEU}/mL (ref 0.00–0.48)

## 2014-02-22 LAB — LACTATE DEHYDROGENASE: LDH: 129 U/L (ref 94–250)

## 2014-02-24 ENCOUNTER — Telehealth: Payer: Self-pay | Admitting: Internal Medicine

## 2014-02-24 LAB — LUPUS ANTICOAGULANT PANEL
DRVVT: 39.3 secs (ref <42.9)
LUPUS ANTICOAGULANT: NOT DETECTED
PTT Lupus Anticoagulant: 34.5 secs (ref 28.0–43.0)

## 2014-02-24 NOTE — Telephone Encounter (Signed)
LEFT MESSAGE FOR PATIENT TO RETURN CALL TO SCHEDULE APPT.  °

## 2014-02-26 ENCOUNTER — Telehealth: Payer: Self-pay | Admitting: Internal Medicine

## 2014-02-26 NOTE — Telephone Encounter (Signed)
left message for patient to return call to schedule appt °

## 2014-03-03 ENCOUNTER — Encounter: Payer: Self-pay | Admitting: Nurse Practitioner

## 2014-03-17 ENCOUNTER — Encounter: Payer: Self-pay | Admitting: Family

## 2014-03-17 ENCOUNTER — Ambulatory Visit (INDEPENDENT_AMBULATORY_CARE_PROVIDER_SITE_OTHER): Payer: BC Managed Care – PPO | Admitting: Family

## 2014-03-17 ENCOUNTER — Telehealth: Payer: Self-pay | Admitting: Nurse Practitioner

## 2014-03-17 VITALS — HR 64 | Wt 140.0 lb

## 2014-03-17 DIAGNOSIS — N390 Urinary tract infection, site not specified: Secondary | ICD-10-CM

## 2014-03-17 DIAGNOSIS — R300 Dysuria: Secondary | ICD-10-CM

## 2014-03-17 MED ORDER — SULFAMETHOXAZOLE-TRIMETHOPRIM 800-160 MG PO TABS: 1.0000 | ORAL_TABLET | Freq: Two times a day (BID) | ORAL | Status: AC

## 2014-03-17 NOTE — Patient Instructions (Signed)
Urinary Tract Infection  Urinary tract infections (UTIs) can develop anywhere along your urinary tract. Your urinary tract is your body's drainage system for removing wastes and extra water. Your urinary tract includes two kidneys, two ureters, a bladder, and a urethra. Your kidneys are a pair of bean-shaped organs. Each kidney is about the size of your fist. They are located below your ribs, one on each side of your spine.  CAUSES  Infections are caused by microbes, which are microscopic organisms, including fungi, viruses, and bacteria. These organisms are so small that they can only be seen through a microscope. Bacteria are the microbes that most commonly cause UTIs.  SYMPTOMS   Symptoms of UTIs may vary by age and gender of the patient and by the location of the infection. Symptoms in young women typically include a frequent and intense urge to urinate and a painful, burning feeling in the bladder or urethra during urination. Older women and men are more likely to be tired, shaky, and weak and have muscle aches and abdominal pain. A fever may mean the infection is in your kidneys. Other symptoms of a kidney infection include pain in your back or sides below the ribs, nausea, and vomiting.  DIAGNOSIS  To diagnose a UTI, your caregiver will ask you about your symptoms. Your caregiver also will ask to provide a urine sample. The urine sample will be tested for bacteria and white blood cells. White blood cells are made by your body to help fight infection.  TREATMENT   Typically, UTIs can be treated with medication. Because most UTIs are caused by a bacterial infection, they usually can be treated with the use of antibiotics. The choice of antibiotic and length of treatment depend on your symptoms and the type of bacteria causing your infection.  HOME CARE INSTRUCTIONS   If you were prescribed antibiotics, take them exactly as your caregiver instructs you. Finish the medication even if you feel better after you  have only taken some of the medication.   Drink enough water and fluids to keep your urine clear or pale yellow.   Avoid caffeine, tea, and carbonated beverages. They tend to irritate your bladder.   Empty your bladder often. Avoid holding urine for long periods of time.   Empty your bladder before and after sexual intercourse.   After a bowel movement, women should cleanse from front to back. Use each tissue only once.  SEEK MEDICAL CARE IF:    You have back pain.   You develop a fever.   Your symptoms do not begin to resolve within 3 days.  SEEK IMMEDIATE MEDICAL CARE IF:    You have severe back pain or lower abdominal pain.   You develop chills.   You have nausea or vomiting.   You have continued burning or discomfort with urination.  MAKE SURE YOU:    Understand these instructions.   Will watch your condition.   Will get help right away if you are not doing well or get worse.  Document Released: 09/07/2005 Document Revised: 05/29/2012 Document Reviewed: 01/06/2012  [employer] Patient Information 2014 [employer]

## 2014-03-17 NOTE — Telephone Encounter (Signed)
Patient's mom called back and rescheduled the appointment.

## 2014-03-17 NOTE — Progress Notes (Signed)
   Subjective:    Patient ID: Belinda EnsignMegan L Nowakowski, female    DOB: 10/14/1996, 18 y.o.   MRN: [id number]  HPI 18 year old caucasian, nonsmoker presenting today for urgency and frequency on urination.  The symptoms present x 2 weeks.  She has back pain and an odor.  She has not taken anything for the symptoms and has not been drinking fluids.  Review of Systems  Constitutional: Negative.  Negative for fever and chills.  Respiratory: Negative.   Cardiovascular: Negative.   Gastrointestinal: Negative.   Genitourinary: Positive for urgency, frequency and vaginal discharge. Negative for flank pain and pelvic pain.       Frequency, urgency, and vaginal discharge  Musculoskeletal: Positive for back pain and joint swelling.   Past Medical History  Diagnosis Date  . Acne   . Dysmenorrhea in the adolescent   . DVT (deep venous thrombosis)   . Factor V deficiency   . 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 DVT, lower extremity, proximal, acute 06/07/2012    History   Social History  . Marital Status: Single    Spouse Name: N/A    Number of Children: N/A  . Years of Education: N/A   Occupational History  . Not on file.   Social History Main Topics  . Smoking status: Never Smoker   . Smokeless tobacco: Not on file  . Alcohol Use: No  . Drug Use: No  . Sexual Activity: Yes    Birth Control/ Protection: None   Other Topics Concern  . Not on file   Social History Narrative   Plays basketball and volleyball   [employer] going into 12th grade grades are good   HH of 3   3 dogs    Past Surgical History  Procedure Laterality Date  . Tonsillectomy    . Tonsillectomy      No family history on file.  No Known Allergies  Current Outpatient Prescriptions on File Prior to Visit  Medication Sig Dispense Refill  . buPROPion (WELLBUTRIN XL) 300 MG 24 hr tablet Take 1 tablet (300 mg total) by mouth daily.  30 tablet  3  .  Rivaroxaban (XARELTO) 20 MG TABS tablet Take 1 tablet (20 mg total) by mouth daily.  30 tablet  4   No current facility-administered medications on file prior to visit.    Pulse 64  Wt 140 lb (63.504 kg)  SpO2 99%chart    Objective:   Physical Exam  Constitutional: She is oriented to person, place, and time. She appears well-developed and well-nourished.  Cardiovascular: Normal rate, regular rhythm and normal heart sounds.   Pulmonary/Chest: Effort normal and breath sounds normal.  Abdominal: Soft. Bowel sounds are normal.  Genitourinary: Vaginal discharge found.  Frequency, urgency, odorous discharge, and  back pain  Musculoskeletal:  Complains of back pain  Neurological: She is alert and oriented to person, place, and time.  Skin: Skin is warm and dry.          Assessment & Plan:  Assessment 1. UTI  Plan 1. Bactrim PO BID x 3 days.  2. Encourage fluid intake.  3.Continue current medications.  4.Conatct office for questions or concerns.

## 2014-03-17 NOTE — Telephone Encounter (Signed)
Patients motehr called during lunch and left a message to cancel daughters appt for friday. Tried to call patient to confirm no answer left message to call us back. Did not cancel appt yet.

## 2014-03-21 ENCOUNTER — Encounter: Payer: Self-pay | Admitting: Nurse Practitioner

## 2014-04-17 ENCOUNTER — Other Ambulatory Visit: Payer: Self-pay

## 2014-04-17 ENCOUNTER — Ambulatory Visit (INDEPENDENT_AMBULATORY_CARE_PROVIDER_SITE_OTHER): Payer: BC Managed Care – PPO | Admitting: Nurse Practitioner

## 2014-04-17 ENCOUNTER — Encounter: Payer: Self-pay | Admitting: Nurse Practitioner

## 2014-04-17 VITALS — BP 116/70 | HR 100 | Ht 67.0 in | Wt 140.0 lb

## 2014-04-17 DIAGNOSIS — Z309 Encounter for contraceptive management, unspecified: Secondary | ICD-10-CM

## 2014-04-17 DIAGNOSIS — L709 Acne, unspecified: Secondary | ICD-10-CM

## 2014-04-17 DIAGNOSIS — L708 Other acne: Secondary | ICD-10-CM

## 2014-04-17 MED ORDER — BUPROPION HCL ER (XL) 300 MG PO TB24: 300.0000 mg | ORAL_TABLET | Freq: Every day | ORAL | Status: DC

## 2014-04-17 MED ORDER — NORETHINDRONE 0.35 MG PO TABS: 1.0000 | ORAL_TABLET | Freq: Every day | ORAL | Status: DC

## 2014-04-17 NOTE — Progress Notes (Signed)
Patient ID: Belinda Long, female   DOB: 03/13/1996, 18 y.o.   MRN: [id number] 18 y.o. G0P0 Single Caucasian Fe here to establish as new GYN care.  Menarche age 18, normally flow 6-7 days. Heavy X 4  super tampon changing every 4 hours. With clotting.  Cramps 1 - 2 days prior to cycle and 2-3 days of cycle.  OTC NSAIDS with very little help.  Some missed classes.  She also has a problem with acne and dermatologist now has her on 3 rd course of Accutane.  She must be on birth control.  The problem is she was initially started on Yaz and had DVT 06/07/2012.  Since then has been diagnosed with Factor V Leiden from her fathers side of family.  She would like to have a progesterone only method such as Nexplanon.  Her friend has this and has been very pleased.  Patient's last menstrual period was 03/24/2014.          Sexually active: no  The current method of family planning is abstinence.    Exercising: yes  jogging and playing sports Smoker:  no  Health Maintenance: TDaP:  UTD Gardasil: 2013 completed Labs: HB:  PCP    reports that she has never smoked. She has never used smokeless tobacco. She reports that she does not drink alcohol or use illicit drugs.  Past Medical History  Diagnosis Date  . Dysmenorrhea in the adolescent   . Factor V deficiency   . 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 DVT, lower extremity, proximal, acute 06/07/2012    left - was on OCP- Yaz    Past Surgical History  Procedure Laterality Date  . Tonsillectomy      Current Outpatient Prescriptions  Medication Sig Dispense Refill  . MYORISAN 40 MG capsule Take 1 capsule by mouth daily.      Marland Kitchen. buPROPion (WELLBUTRIN XL) 300 MG 24 hr tablet Take 1 tablet (300 mg total) by mouth daily.  30 tablet  3  . norethindrone (MICRONOR,CAMILA,ERRIN) 0.35 MG tablet Take 1 tablet (0.35 mg total) by mouth daily.  1 Package  0  . Rivaroxaban (XARELTO) 20 MG TABS tablet Take 1  tablet (20 mg total) by mouth daily.  30 tablet  4   No current facility-administered medications for this visit.    Family History  Problem Relation Age of Onset  . Cancer Maternal Grandmother     myeloma  . Hyperlipidemia Mother   . Depression Mother   . Factor V Leiden deficiency Father   . Prostate cancer Paternal Grandfather     ROS:  Pertinent items are noted in HPI.  Otherwise, a comprehensive ROS was negative.  Exam:   BP 116/70  Pulse 100  Ht 5\' 7"  (1.702 m)  Wt 140 lb (63.504 kg)  BMI 21.92 kg/m2  LMP 03/24/2014 Height: 5\' 7"  (170.2 cm)  Ht Readings from Last 3 Encounters:  04/17/14 5\' 7"  (1.702 m)  01/16/14 5\' 7"  (1.702 m) (86%*, Z = 1.06)  08/14/12 5\' 7"  (1.702 m) (86%*, Z = 1.09)   * Growth percentiles are based on CDC 2-20 Years data.    General appearance: alert, cooperative and appears stated age Head: Normocephalic, without obvious abnormality, atraumatic Neck: no adenopathy, supple, symmetrical, trachea midline and thyroid normal to inspection and palpation Lungs: clear to auscultation bilaterally Heart: regular rate and rhythm Abdomen: soft, non-tender; no masses,  no organomegaly Extremities: extremities normal, atraumatic, no cyanosis or edema, minimal swelling still in left calf. Skin: Skin color, texture, turgor normal. No rashes or lesions Neurologic: Grossly normal   Pelvic: not indicated at this time              Bimanual Exam:  Not indicated at this time   A:  History of acne  Accutane therapy - must have 2 methods of birth control  History of  DVT on Yaz - on Xarelto 06/07/2012  Never SA, BUM is condoms if needed  P:   Reviewed health and wellness pertinent to exam  Counseled with all options of progesterone options: Skyla IUD, Nexplanon, Depo Provera, POP  She is given information about Nexplanon and will send info to insurance department.  Given potential SE with increase in acne and BTB.  She is also started on Micronor today as  that is required by dermatologist to continue with Accutane. Discussed potential side effects and start date today. BUM if needed in the interim.  Counseled on breast self exam, STD prevention, HIV risk factors and prevention, family planning choices, adequate intake of calcium and vitamin D, diet and exercise  An After Visit Summary was printed and given to the patient.

## 2014-04-17 NOTE — Patient Instructions (Signed)
General topics  Next pap or exam is  due in 1 year Take a Women's multivitamin Take 1200 mg. of calcium daily - prefer dietary If any concerns in interim to call back  Breast Self-Awareness Practicing breast self-awareness may pick up problems early, prevent significant medical complications, and possibly save your life. By practicing breast self-awareness, you can become familiar with how your breasts look and feel and if your breasts are changing. This allows you to notice changes early. It can also offer you some reassurance that your breast health is good. One way to learn what is normal for your breasts and whether your breasts are changing is to do a breast self-exam. If you find a lump or something that was not present in the past, it is best to contact your caregiver right away. Other findings that should be evaluated by your caregiver include nipple discharge, especially if it is bloody; skin changes or reddening; areas where the skin seems to be pulled in (retracted); or new lumps and bumps. Breast pain is seldom associated with cancer (malignancy), but should also be evaluated by a caregiver. BREAST SELF-EXAM The best time to examine your breasts is 5 7 days after your menstrual period is over.  ExitCare Patient Information 2013 [employer].   Exercise to Stay Healthy Exercise helps you become and stay healthy. EXERCISE IDEAS AND TIPS Choose exercises that:  You enjoy.  Fit into your day. You do not need to exercise really hard to be healthy. You can do exercises at a slow or medium level and stay healthy. You can:  Stretch before and after working out.  Try yoga, Pilates, or tai chi.  Lift weights.  Walk fast, swim, jog, run, climb stairs, bicycle, dance, or rollerskate.  Take aerobic classes. Exercises that burn about 150 calories:  Running 1  miles in 15 minutes.  Playing volleyball for 45 to 60 minutes.  Washing and waxing a car for 45 to 60  minutes.  Playing touch football for 45 minutes.  Walking 1  miles in 35 minutes.  Pushing a stroller 1  miles in 30 minutes.  Playing basketball for 30 minutes.  Raking leaves for 30 minutes.  Bicycling 5 miles in 30 minutes.  Walking 2 miles in 30 minutes.  Dancing for 30 minutes.  Shoveling snow for 15 minutes.  Swimming laps for 20 minutes.  Walking up stairs for 15 minutes.  Bicycling 4 miles in 15 minutes.  Gardening for 30 to 45 minutes.  Jumping rope for 15 minutes.  Washing windows or floors for 45 to 60 minutes. Document Released: 12/31/2010 Document Revised: 02/20/2012 Document Reviewed: 12/31/2010 ExitCare Patient Information 2013 [employer].   Other topics ( that may be useful information):    Sexually Transmitted Disease Sexually transmitted disease (STD) refers to any infection that is passed from person to person during sexual activity. This may happen by way of saliva, semen, blood, vaginal mucus, or urine. Common STDs include:  Gonorrhea.  Chlamydia.  Syphilis.  HIV/AIDS.  Genital herpes.  Hepatitis B and C.  Trichomonas.  Human papillomavirus (HPV).  Pubic lice. CAUSES  An STD may be spread by bacteria, virus, or parasite. A person can get an STD by:  Sexual intercourse with an infected person.  Sharing sex toys with an infected person.  Sharing needles with an infected person.  Having intimate contact with the genitals, mouth, or rectal areas of an infected person. SYMPTOMS  Some people may not have any symptoms, but   they can still pass the infection to others. Different STDs have different symptoms. Symptoms include:  Painful or bloody urination.  Pain in the pelvis, abdomen, vagina, anus, throat, or eyes.  Skin rash, itching, irritation, growths, or sores (lesions). These usually occur in the genital or anal area.  Abnormal vaginal discharge.  Penile discharge in men.  Soft, flesh-colored skin growths in the  genital or anal area.  Fever.  Pain or bleeding during sexual intercourse.  Swollen glands in the groin area.  Yellow skin and eyes (jaundice). This is seen with hepatitis. DIAGNOSIS  To make a diagnosis, your caregiver may:  Take a medical history.  Perform a physical exam.  Take a specimen (culture) to be examined.  Examine a sample of discharge under a microscope.  Perform blood test TREATMENT   Chlamydia, gonorrhea, trichomonas, and syphilis can be cured with antibiotic medicine.  Genital herpes, hepatitis, and HIV can be treated, but not cured, with prescribed medicines. The medicines will lessen the symptoms.  Genital warts from HPV can be treated with medicine or by freezing, burning (electrocautery), or surgery. Warts may come back.  HPV is a virus and cannot be cured with medicine or surgery.However, abnormal areas may be followed very closely by your caregiver and may be removed from the cervix, vagina, or vulva through office procedures or surgery. If your diagnosis is confirmed, your recent sexual partners need treatment. This is true even if they are symptom-free or have a negative culture or evaluation. They should not have sex until their caregiver says it is okay. HOME CARE INSTRUCTIONS  All sexual partners should be informed, tested, and treated for all STDs.  Take your antibiotics as directed. Finish them even if you start to feel better.  Only take over-the-counter or prescription medicines for pain, discomfort, or fever as directed by your caregiver.  Rest.  Eat a balanced diet and drink enough fluids to keep your urine clear or pale yellow.  Do not have sex until treatment is completed and you have followed up with your caregiver. STDs should be checked after treatment.  Keep all follow-up appointments, Pap tests, and blood tests as directed by your caregiver.  Only use latex condoms and water-soluble lubricants during sexual activity. Do not use  petroleum jelly or oils.  Avoid alcohol and illegal drugs.  Get vaccinated for HPV and hepatitis. If you have not received these vaccines in the past, talk to your caregiver about whether one or both might be right for you.  Avoid risky sex practices that can break the skin. The only way to avoid getting an STD is to avoid all sexual activity.Latex condoms and dental dams (for oral sex) will help lessen the risk of getting an STD, but will not completely eliminate the risk. SEEK MEDICAL CARE IF:   You have a fever.  You have any new or worsening symptoms. Document Released: 02/18/2003 Document Revised: 02/20/2012 Document Reviewed: 02/25/2011 [facility] Patient Information 2013 [employer].    Domestic Abuse You are being battered or abused if someone close to you hits, pushes, or physically hurts you in any way. You also are being abused if you are forced into activities. You are being sexually abused if you are forced to have sexual contact of any kind. You are being emotionally abused if you are made to feel worthless or if you are constantly threatened. It is important to remember that help is available. No one has the right to abuse you. PREVENTION OF FURTHER  ABUSE  Learn the warning signs of danger. This varies with situations but may include: the use of alcohol, threats, isolation from friends and family, or forced sexual contact. Leave if you feel that violence is going to occur.  If you are attacked or beaten, report it to the police so the abuse is documented. You do not have to press charges. The police can protect you while you or the attackers are leaving. Get the officer's name and badge number and a copy of the report.  Find someone you can trust and tell them what is happening to you: your caregiver, a nurse, clergy member, close friend or family member. Feeling ashamed is natural, but remember that you have done nothing wrong. No one deserves abuse. Document Released:  11/25/2000 Document Revised: 02/20/2012 Document Reviewed: 02/03/2011 ExitCare Patient Information 2013 [employer]Care[employer].    How Much is Too Much Alcohol? Drinking too much alcohol can cause injury, accidents, and health problems. These types of problems can include:   Car crashes.  Falls.  Family fighting (domestic violence).  Drowning.  Fights.  Injuries.  Burns.  Damage to certain organs.  Having a baby with birth defects. ONE DRINK CAN BE TOO MUCH WHEN YOU ARE:  Working.  Pregnant or breastfeeding.  Taking medicines. Ask your doctor.  Driving or planning to drive. If you or someone you know has a drinking problem, get help from a doctor.  Document Released: 09/24/2009 Document Revised: 02/20/2012 Document Reviewed: 09/24/2009 ExitCare Patient Information 2013 [employer].   Smoking Hazards Smoking cigarettes is extremely bad for your health. Tobacco smoke has over 200 known poisons in it. There are over 60 chemicals in tobacco smoke that cause cancer. Some of the chemicals found in cigarette smoke include:   Cyanide.  Benzene.  Formaldehyde.  Methanol (wood alcohol).  Acetylene (fuel used in welding torches).  Ammonia. Cigarette smoke also contains the poisonous gases nitrogen oxide and carbon monoxide.  Cigarette smokers have an increased risk of many serious medical problems and Smoking causes approximately:  90% of all lung cancer deaths in men.  80% of all lung cancer deaths in women.  90% of deaths from chronic obstructive lung disease. Compared with nonsmokers, smoking increases the risk of:  Coronary heart disease by 2 to 4 times.  Stroke by 2 to 4 times.  Men developing lung cancer by 23 times.  Women developing lung cancer by 13 times.  Dying from chronic obstructive lung diseases by 12 times.  . Smoking is the most preventable cause of death and disease in our society.  WHY IS SMOKING ADDICTIVE?  Nicotine is the chemical  agent in tobacco that is capable of causing addiction or dependence.  When you smoke and inhale, nicotine is absorbed rapidly into the bloodstream through your lungs. Nicotine absorbed through the lungs is capable of creating a powerful addiction. Both inhaled and non-inhaled nicotine may be addictive.  Addiction studies of cigarettes and spit tobacco show that addiction to nicotine occurs mainly during the teen years, when young people begin using tobacco products. WHAT ARE THE BENEFITS OF QUITTING?  There are many health benefits to quitting smoking.   Likelihood of developing cancer and heart disease decreases. Health improvements are seen almost immediately.  Blood pressure, pulse rate, and breathing patterns start returning to normal soon after quitting. QUITTING SMOKING   American Lung Association - 1-800-LUNGUSA  American Cancer Society - 1-800-ACS-2345 Document Released: 01/05/2005 Document Revised: 02/20/2012 Document Reviewed: 09/09/2009 ExitCare Patient Information 2013 ExitCare,   LLC.   Stress Management Stress is a state of physical or mental tension that often results from changes in your life or normal routine. Some common causes of stress are:  Death of a loved one.  Injuries or severe illnesses.  Getting fired or changing jobs.  Moving into a new home. Other causes may be:  Sexual problems.  Business or financial losses.  Taking on a large debt.  Regular conflict with someone at home or at work.  Constant tiredness from lack of sleep. It is not just bad things that are stressful. It may be stressful to:  Win the lottery.  Get married.  Buy a new car. The amount of stress that can be easily tolerated varies from person to person. Changes generally cause stress, regardless of the types of change. Too much stress can affect your health. It may lead to physical or emotional problems. Too little stress (boredom) may also become stressful. SUGGESTIONS TO  REDUCE STRESS:  Talk things over with your family and friends. It often is helpful to share your concerns and worries. If you feel your problem is serious, you may want to get help from a professional counselor.  Consider your problems one at a time instead of lumping them all together. Trying to take care of everything at once may seem impossible. List all the things you need to do and then start with the most important one. Set a goal to accomplish 2 or 3 things each day. If you expect to do too many in a single day you will naturally fail, causing you to feel even more stressed.  Do not use alcohol or drugs to relieve stress. Although you may feel better for a short time, they do not remove the problems that caused the stress. They can also be habit forming.  Exercise regularly - at least 3 times per week. Physical exercise can help to relieve that "uptight" feeling and will relax you.  The shortest distance between despair and hope is often a good night's sleep.  Go to bed and get up on time allowing yourself time for appointments without being rushed.  Take a short "time-out" period from any stressful situation that occurs during the day. Close your eyes and take some deep breaths. Starting with the muscles in your face, tense them, hold it for a few seconds, then relax. Repeat this with the muscles in your neck, shoulders, hand, stomach, back and legs.  Take good care of yourself. Eat a balanced diet and get plenty of rest.  Schedule time for having fun. Take a break from your daily routine to relax. HOME CARE INSTRUCTIONS   Call if you feel overwhelmed by your problems and feel you can no longer manage them on your own.  Return immediately if you feel like hurting yourself or someone else. Document Released: 05/24/2001 Document Revised: 02/20/2012 Document Reviewed: 01/14/2008 ExitCare Patient Information 2013 ExitCare, LLC.  

## 2014-04-20 NOTE — Progress Notes (Signed)
Encounter reviewed by Dr. Brook Silva.  

## 2014-04-21 ENCOUNTER — Telehealth: Payer: Self-pay | Admitting: Nurse Practitioner

## 2014-04-21 NOTE — Telephone Encounter (Signed)
Left message for patient to call back. Need to go over nexplanon benefits. °

## 2014-04-21 NOTE — Telephone Encounter (Signed)
Spoke with patient. Advised that per benefits quote received, Nexplanon will be covered at 100%. There will be 0 patient liability. Patient is to call within the first 5 days of her cycle to schedule insertion.

## 2014-04-21 NOTE — Telephone Encounter (Signed)
Patient was retuning sabrinas call at lunch

## 2014-04-25 NOTE — Telephone Encounter (Signed)
Patient calling to schedule Nexplanon. She reports she started her menstrual cycle yesterday.

## 2014-04-25 NOTE — Telephone Encounter (Signed)
Patient is calling Belinda Long back °

## 2014-04-25 NOTE — Telephone Encounter (Signed)
Spoke with patient. Patient started menses yesterday and would like to schedule nexplanon insertion. Advised insertion is done days 1-5 of cycle so we will have to get patient in on Monday. Appointment scheduled with Dr.Silva at 1300 on 5/18 (time per Kennon RoundsSally). Patient agreeable to date and time.  Routing to provider for final review. Patient agreeable to disposition. Will close encounter  Routing to Dr.Silva as covering ZO:[id number]:Patricia Rolen-Grubb, FNP

## 2014-04-25 NOTE — Telephone Encounter (Signed)
Left message to call Tammela Bales at [phone]. 

## 2014-04-28 ENCOUNTER — Encounter: Payer: Self-pay | Admitting: Obstetrics and Gynecology

## 2014-04-28 ENCOUNTER — Ambulatory Visit (INDEPENDENT_AMBULATORY_CARE_PROVIDER_SITE_OTHER): Payer: BC Managed Care – PPO | Admitting: Obstetrics and Gynecology

## 2014-04-28 VITALS — BP 110/66 | HR 62 | Temp 98.7°F | Wt 137.0 lb

## 2014-04-28 DIAGNOSIS — Z309 Encounter for contraceptive management, unspecified: Secondary | ICD-10-CM

## 2014-04-28 DIAGNOSIS — Z30017 Encounter for initial prescription of implantable subdermal contraceptive: Secondary | ICD-10-CM

## 2014-04-28 DIAGNOSIS — L709 Acne, unspecified: Secondary | ICD-10-CM

## 2014-04-28 DIAGNOSIS — L708 Other acne: Secondary | ICD-10-CM

## 2014-04-28 NOTE — Progress Notes (Signed)
Patient ID: Belinda EnsignMegan L Weddington, female   DOB: 12/15/1995, 18 y.o.   MRN: [id number] GYNECOLOGY  VISIT   HPI: 18 y.o.   Single  Caucasian  female   G0P0 with Patient's last menstrual period was 04/24/2014.   here for Nexplanon insertion.   History of Factor V Leiden mutation.  Patient state she has another blood disorder and is uncertain of the name - lupus anticoagulant? Stopped Xarelto one week ago.   On Accutane. Uses Micronor.  Has heavy menses.   GYNECOLOGIC HISTORY: Patient's last menstrual period was 04/24/2014. Contraception: abstinence.   Menopausal hormone therapy: n/a        OB History   Grav Para Term Preterm Abortions TAB SAB Ect Mult Living   0                  Patient Active Problem List   Diagnosis Date Noted  . Perleche 03/21/2013  . 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 Adjustment disorder with depressed mood 09/19/2012  . Body image problem 09/19/2012  . Anemia 08/14/2012  . DVT (deep venous thrombosis) 06/22/2012  . Chronic anticoagulation 06/22/2012  . Encounter for monitoring coumadin therapy 06/12/2012  . Heterozygous factor V Leiden mutation 06/12/2012  . Wears glasses 07/06/2011  . Left wrist injury 07/06/2011  . Well adolescent visit 07/06/2011  . PRIMARY DYSMENORRHEA 08/10/2010  . KNEE PAIN, BILATERAL 08/10/2010  . ALLERGIC RHINITIS 03/19/2010  . ACNE 10/17/2007    Past Medical History  Diagnosis Date  . Dysmenorrhea in the adolescent   . Factor V deficiency   . 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 DVT, lower extremity, proximal, acute 06/07/2012    left - was on OCP- Yaz    Past Surgical History  Procedure Laterality Date  . Tonsillectomy      Current Outpatient Prescriptions  Medication Sig Dispense Refill  . buPROPion (WELLBUTRIN XL) 300 MG 24 hr tablet Take 1 tablet (300 mg total) by mouth daily.  30 tablet  3  . MYORISAN 40 MG capsule Take 1 capsule by  mouth daily.      . norethindrone (MICRONOR,CAMILA,ERRIN) 0.35 MG tablet Take 1 tablet (0.35 mg total) by mouth daily.  1 Package  0  . Rivaroxaban (XARELTO) 20 MG TABS tablet Take 1 tablet (20 mg total) by mouth daily.  30 tablet  4   No current facility-administered medications for this visit.     ALLERGIES: Review of patient's allergies indicates no known allergies.  Family History  Problem Relation Age of Onset  . Cancer Maternal Grandmother     myeloma  . Hyperlipidemia Mother   . Depression Mother   . Factor V Leiden deficiency Father   . Prostate cancer Paternal Grandfather     History   Social History  . Marital Status: Single    Spouse Name: N/A    Number of Children: 0  . Years of Education: N/A   Occupational History  . Not on file.   Social History Main Topics  . Smoking status: Never Smoker   . Smokeless tobacco: Never Used  . Alcohol Use: No  . Drug Use: No  . Sexual Activity: Not Currently    Partners: Male    Birth Control/ Protection: Abstinence   Other Topics Concern  . Not on file   Social History Narrative   Plays basketball and volleyball   [employer]  going into 12th grade grades are good   HH of 3   3 dogs    ROS:  Pertinent items are noted in HPI.  PHYSICAL EXAMINATION:    BP 110/66  Pulse 62  Temp(Src) 98.7 F (37.1 C)  Wt 137 lb (62.143 kg)  LMP 04/24/2014     General appearance: alert, cooperative and appears stated age    Nexplanon Insertion - Lot number  [id number]     Expiration 10/2016  Consent for procedure. Sterile prep of left arm with betadine.  Landmarks identified.  Local 1% lidocaine, lot # [id number], Expiration 07/12/14 - 2 cc. Nexplanon placed without difficulty. Sterile bandage and dressing placed.  Patient given Coke and crackers after procedure.  Did not eat this afternoon.  No complications.  Minimal EBL.  ASSESSMENT    Nexplanon placement.  Factor V Leiden mutation.  PLAN  Follow up  in 5 weeks for a recheck.  Instructions and precautions given.    An After Visit Summary was printed and given to the patient.  __15____ minutes face to face time of which over 50% was spent in counseling.

## 2014-06-04 ENCOUNTER — Encounter: Payer: Self-pay | Admitting: Obstetrics and Gynecology

## 2014-06-04 ENCOUNTER — Ambulatory Visit (INDEPENDENT_AMBULATORY_CARE_PROVIDER_SITE_OTHER): Payer: BC Managed Care – PPO | Admitting: Obstetrics and Gynecology

## 2014-06-04 VITALS — BP 104/70 | HR 76 | Ht 67.0 in | Wt 139.0 lb

## 2014-06-04 DIAGNOSIS — Z308 Encounter for other contraceptive management: Secondary | ICD-10-CM

## 2014-06-04 NOTE — Progress Notes (Signed)
Patient ID: Belinda Long, female   DOB: 03/09/1996, 18 y.o.   MRN: [id number] GYNECOLOGY  VISIT   HPI: 18 y.o.   Single  Caucasian  female   G0P0 with Patient's last menstrual period was 04/24/2014.   here for Nexplanon followup. Nexplanon inserted on 04/28/14.   Has Factor V Leiden mutation.   Asking about ETOH use and Nexplanon.   GYNECOLOGIC HISTORY: Patient's last menstrual period was 04/24/2014. Contraception:   Nexplanon Menopausal hormone therapy: none Sexual activity:  None.  OB History   Grav Para Term Preterm Abortions TAB SAB Ect Mult Living   0                  Patient Active Problem List   Diagnosis Date Noted  . Perleche 03/21/2013  . 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 Adjustment disorder with depressed mood 09/19/2012  . Body image problem 09/19/2012  . Anemia 08/14/2012  . DVT (deep venous thrombosis) 06/22/2012  . Chronic anticoagulation 06/22/2012  . Encounter for monitoring coumadin therapy 06/12/2012  . Heterozygous factor V Leiden mutation 06/12/2012  . Wears glasses 07/06/2011  . Left wrist injury 07/06/2011  . Well adolescent visit 07/06/2011  . PRIMARY DYSMENORRHEA 08/10/2010  . KNEE PAIN, BILATERAL 08/10/2010  . ALLERGIC RHINITIS 03/19/2010  . ACNE 10/17/2007    Past Medical History  Diagnosis Date  . Dysmenorrhea in the adolescent   . Factor V deficiency   . 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 DVT, lower extremity, proximal, acute 06/07/2012    left - was on OCP- Yaz    Past Surgical History  Procedure Laterality Date  . Tonsillectomy      Current Outpatient Prescriptions  Medication Sig Dispense Refill  . buPROPion (WELLBUTRIN XL) 300 MG 24 hr tablet Take 1 tablet (300 mg total) by mouth daily.  30 tablet  3  . etonogestrel (NEXPLANON) 68 MG IMPL implant Inject 1 each into the skin once.      Marland Kitchen. MYORISAN 40 MG capsule Take 1 capsule by mouth daily.       . Rivaroxaban (XARELTO) 20 MG TABS tablet Take 1 tablet (20 mg total) by mouth daily.  30 tablet  4   No current facility-administered medications for this visit.     ALLERGIES: Review of patient's allergies indicates no known allergies.  Family History  Problem Relation Age of Onset  . Cancer Maternal Grandmother     myeloma  . Hyperlipidemia Mother   . Depression Mother   . Factor V Leiden deficiency Father   . Prostate cancer Paternal Grandfather     History   Social History  . Marital Status: Single    Spouse Name: N/A    Number of Children: 0  . Years of Education: N/A   Occupational History  . Not on file.   Social History Main Topics  . Smoking status: Never Smoker   . Smokeless tobacco: Never Used  . Alcohol Use: No  . Drug Use: No  . Sexual Activity: Not Currently    Partners: Male    Birth Control/ Protection: Abstinence   Other Topics Concern  . Not on file   Social History Narrative   Plays basketball and volleyball   [employer] going into 12th grade grades are good   HH of 3   3 dogs    ROS:  Pertinent items are noted  in HPI.  PHYSICAL EXAMINATION:    BP 104/70  Pulse 76  Ht 5\' 7"  (1.702 m)  Wt 139 lb (63.05 kg)  BMI 21.77 kg/m2  LMP 04/24/2014     General appearance: alert, cooperative and appears stated age Left arm - Nexplanon palpable and intact.   ASSESSMENT  Nexplanon patient.  Amenorrhea.  Factor V Leiden mutation.  History of DVT on combined oral contraception.   PLAN  Reassurance regarding amenorrhea with Nexplanon.  Cautioned about ETOH use as a minor.  Discussed potential general health risks of use.  Discussed Factor V Leiden and need for Maternal Fetal Medicine consultation prior to pregnancy.  Return for annual exam with Belinda Long in May 2016.    An After Visit Summary was printed and given to the patient.  __15____ minutes face to face time of which over 50% was spent in counseling.

## 2014-08-11 ENCOUNTER — Telehealth: Payer: Self-pay | Admitting: Nurse Practitioner

## 2014-08-11 DIAGNOSIS — Z3046 Encounter for surveillance of implantable subdermal contraceptive: Secondary | ICD-10-CM

## 2014-08-11 NOTE — Telephone Encounter (Signed)
Spoke with patient as no release signed to speak with mother. Patient states that her nexplanon "feels uncomfortable. I feel it in my arm at night without touching it or bumping it or anything. I am also gaining a lot of weight and I am not okay with that." Patient would like to come in for removal this week. Offered Wednesday at 2:45pm but patient declines. Appointment scheduled for Friday 9/4 at 3:15pm with Dr.Silva. Patient agreeable to date and time. Order placed. Will need precert.  Cc: Cathrine Muster  Routing to provider for final review. Patient agreeable to disposition. Will close encounter

## 2014-08-11 NOTE — Telephone Encounter (Signed)
Patient's mom "[name]" is asking for an appointment for her daughter to have Nexplanon removed. [name] says "its giving her some trouble" and she wants it removed. Release in chart ( no names on release).

## 2014-08-15 ENCOUNTER — Encounter: Payer: Self-pay | Admitting: Obstetrics and Gynecology

## 2014-08-15 ENCOUNTER — Ambulatory Visit (INDEPENDENT_AMBULATORY_CARE_PROVIDER_SITE_OTHER): Payer: BC Managed Care – PPO | Admitting: Obstetrics and Gynecology

## 2014-08-15 VITALS — BP 100/70 | HR 60 | Resp 16 | Ht 67.0 in | Wt 141.0 lb

## 2014-08-15 DIAGNOSIS — R635 Abnormal weight gain: Secondary | ICD-10-CM

## 2014-08-15 DIAGNOSIS — Z3046 Encounter for surveillance of implantable subdermal contraceptive: Secondary | ICD-10-CM

## 2014-08-15 NOTE — Progress Notes (Signed)
GYNECOLOGY  VISIT   HPI: 18 y.o.   Single  Caucasian  female   G0P0 with No LMP recorded. Patient has had an implant.   here for Nexplanon Removal.   Patient's mother present for the discussion today.   Patient ambivalent about taking it out.  Nexplanon has been in since the middle of May. Used Ortho Micronor prior to this.  History of heavy menses. Never sexually active.   Patient is on Accutane for acne and will soon finish her course of this.  Worried about acne. More worried about weight gain. Does not understand why she is gaining over the last 2 years.  Some discomfort on dorsal side of left arm (side of Nexplanon)  - randomly.  Tingling.  No trauma to left arm.   Taking Wellbutrin.  History of DVT while on combined OCPs.  Has Factor V Leiden mutation.   GYNECOLOGIC HISTORY: No LMP recorded. Patient has had an implant. Contraception:  Nexplanon  Menopausal hormone therapy: N/A        OB History   Grav Para Term Preterm Abortions TAB SAB Ect Mult Living   0                  Patient Active Problem List   Diagnosis Date Noted  . Perleche 03/21/2013  . 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 Adjustment disorder with depressed mood 09/19/2012  . Body image problem 09/19/2012  . Anemia 08/14/2012  . DVT (deep venous thrombosis) 06/22/2012  . Chronic anticoagulation 06/22/2012  . Encounter for monitoring coumadin therapy 06/12/2012  . Heterozygous factor V Leiden mutation 06/12/2012  . Wears glasses 07/06/2011  . Left wrist injury 07/06/2011  . Well adolescent visit 07/06/2011  . PRIMARY DYSMENORRHEA 08/10/2010  . KNEE PAIN, BILATERAL 08/10/2010  . ALLERGIC RHINITIS 03/19/2010  . ACNE 10/17/2007    Past Medical History  Diagnosis Date  . Dysmenorrhea in the adolescent   . Factor V deficiency   . 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 DVT, lower extremity, proximal, acute 06/07/2012   left - was on OCP- Yaz    Past Surgical History  Procedure Laterality Date  . Tonsillectomy      Current Outpatient Prescriptions  Medication Sig Dispense Refill  . buPROPion (WELLBUTRIN XL) 300 MG 24 hr tablet Take 1 tablet (300 mg total) by mouth daily.  30 tablet  3  . MYORISAN 40 MG capsule Take 1 capsule by mouth daily.      . Rivaroxaban (XARELTO) 20 MG TABS tablet Take 1 tablet (20 mg total) by mouth daily.  30 tablet  4  . etonogestrel (NEXPLANON) 68 MG IMPL implant Inject 1 each into the skin once.       No current facility-administered medications for this visit.     ALLERGIES: Review of patient's allergies indicates no known allergies.  Family History  Problem Relation Age of Onset  . Cancer Maternal Grandmother     myeloma  . Hyperlipidemia Mother   . Depression Mother   . Factor V Leiden deficiency Father   . Prostate cancer Paternal Grandfather     History   Social History  . Marital Status: Single    Spouse Name: N/A    Number of Children: 0  . Years of Education: N/A   Occupational History  . Not on file.   Social History Main Topics  . Smoking  status: Never Smoker   . Smokeless tobacco: Never Used  . Alcohol Use: No  . Drug Use: No  . Sexual Activity: Not Currently    Partners: Male    Birth Control/ Protection: Abstinence   Other Topics Concern  . Not on file   Social History Narrative   Plays basketball and volleyball   [employer] going into 12th grade grades are good   HH of 3   3 dogs    ROS:  Pertinent items are noted in HPI.  PHYSICAL EXAMINATION:    BP 100/70  Pulse 60  Resp 16  Ht  (1.702 m)  Wt 141 lb (63.957 kg)  BMI 22.08 kg/m2     ASSESSMENT  Nexplanon patient.  Weight gain.  Acne.  On Accutane.  Positive Factor V Leiden Mutation.  On Xarelto.   PLAN  Will check TSH.  I discussed the possible role of Wellbutrin on her weight and I encouraged her to have a conversation with her PCP regarding  this.  We discussed other options for contraception while on Accutane such as returning to Ortho Micronor.  She may choose to have no contraception if she is not sexually active and just take extra strength Tylenol or use Ibuprofen sparingly.  Patient may choose to do one intervention at a time or she will not know the full effects of each decision.  Invited to return if she chooses to have the Nexplanon removed.    An After Visit Summary was printed and given to the patient.  _25_____ minutes face to face time of which over 50% was spent in counseling.

## 2014-08-16 LAB — TSH: TSH: 1.064 u[IU]/mL (ref 0.350–4.500)

## 2014-08-19 ENCOUNTER — Encounter: Payer: Self-pay | Admitting: Obstetrics and Gynecology

## 2014-08-19 ENCOUNTER — Telehealth: Payer: Self-pay

## 2014-08-19 NOTE — Telephone Encounter (Signed)
Left message to call Lizzett Nobile at [phone]. 

## 2014-08-19 NOTE — Telephone Encounter (Signed)
Message copied by Jannet Askew on Tue Aug 19, 2014  4:34 PM ------      Message from: Ricki Miller DE Gwenevere Ghazi, BROOK E      Created: Tue Aug 19, 2014  4:00 PM       Please let patient know that her thyroid function is normal.             Patient was in doubt about removal of her Nexplanon when I saw her the other day, so we chose together not to remove it.             She had concerns about her weight, so I recommended checking her TSH level and discussing her Wellbutrin with her PCP.             Patient is finishing a course of Accutane and needs some method of contraception even though she is not sexually active.       She cannot take OCPs with estrogen as she is positive for Factor V Leiden.            If she had changed her mind in any way, I can remove the Nexplanon for her.        ------

## 2014-08-20 NOTE — Telephone Encounter (Signed)
Spoke with patient. Advised of message as seen below from Dr.Silva. Patient is agreeable and verbalizes understanding. Patient has an appointment with PCP on Friday and will discuss medications at that time. Patient does not want to have nexplanon removed at this time. If she changes her mind will call back to schedule.   Routing to provider for final review. Patient agreeable to disposition. Will close encounter

## 2014-08-21 ENCOUNTER — Ambulatory Visit: Payer: BC Managed Care – PPO | Admitting: Family

## 2014-08-22 ENCOUNTER — Ambulatory Visit (INDEPENDENT_AMBULATORY_CARE_PROVIDER_SITE_OTHER): Payer: BC Managed Care – PPO | Admitting: Family

## 2014-08-22 ENCOUNTER — Ambulatory Visit: Payer: BC Managed Care – PPO | Admitting: Family

## 2014-08-22 ENCOUNTER — Telehealth: Payer: Self-pay | Admitting: Obstetrics and Gynecology

## 2014-08-22 ENCOUNTER — Encounter: Payer: Self-pay | Admitting: Family

## 2014-08-22 VITALS — BP 92/60 | HR 66 | Wt 143.0 lb

## 2014-08-22 DIAGNOSIS — F411 Generalized anxiety disorder: Secondary | ICD-10-CM

## 2014-08-22 DIAGNOSIS — Z3046 Encounter for surveillance of implantable subdermal contraceptive: Secondary | ICD-10-CM

## 2014-08-22 DIAGNOSIS — F3289 Other specified depressive episodes: Secondary | ICD-10-CM

## 2014-08-22 DIAGNOSIS — F32A Depression, unspecified: Secondary | ICD-10-CM

## 2014-08-22 DIAGNOSIS — F329 Major depressive disorder, single episode, unspecified: Secondary | ICD-10-CM

## 2014-08-22 MED ORDER — DESVENLAFAXINE SUCCINATE ER 50 MG PO TB24: 50.0000 mg | ORAL_TABLET | Freq: Every day | ORAL | Status: DC

## 2014-08-22 NOTE — Patient Instructions (Signed)

## 2014-08-22 NOTE — Progress Notes (Signed)
Pre visit review using our clinic review tool, if applicable. No additional management support is needed unless otherwise documented below in the visit note. 

## 2014-08-22 NOTE — Telephone Encounter (Signed)
Patient is ready to have her Nexplanon removed.

## 2014-08-22 NOTE — Progress Notes (Signed)
Subjective:    Patient ID: Belinda Long, female    DOB: [birth year], 18 y.o.   MRN: [id number]  HPI 18 year old white female, nonsmoker, is in today with worsening depression and anxiety. Is currently on Wellbutrin XL 300 mg once daily that is not helping. 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 Feels isolated from her sorority sisters. Feels she is gaining weight. Denies any feelings of helplessness, hopelessness, or thoughts of death or dying.     Review of Systems  Constitutional: Negative.   Respiratory: Negative.   Cardiovascular: Negative.   Gastrointestinal: Negative.   Endocrine: Negative.   Musculoskeletal: Negative.   Allergic/Immunologic: Negative.   Neurological: Negative.   Psychiatric/Behavioral: Positive for sleep disturbance. The patient is nervous/anxious.        Feels sad and depressed   Past Medical History  Diagnosis Date  . Dysmenorrhea in the adolescent   . Factor V deficiency   . 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 DVT, lower extremity, proximal, acute 06/07/2012    left - was on OCP- Yaz    History   Social History  . Marital Status: Single    Spouse Name: N/A    Number of Children: 0  . Years of Education: N/A   Occupational History  . Not on file.   Social History Main Topics  . Smoking status: Never Smoker   . Smokeless tobacco: Never Used  . Alcohol Use: No  . Drug Use: No  . Sexual Activity: Not Currently    Partners: Male    Birth Control/ Protection: Abstinence   Other Topics Concern  . Not on file   Social History Narrative   Plays basketball and volleyball   [employer] going into 12th grade grades are good   HH of 3   3 dogs    Past Surgical History  Procedure Laterality Date  . Tonsillectomy      Family History  Problem Relation Age of Onset  . Cancer Maternal Grandmother     myeloma  . Hyperlipidemia Mother   . Depression  Mother   . Factor V Leiden deficiency Father   . Prostate cancer Paternal Grandfather     No Known Allergies  Current Outpatient Prescriptions on File Prior to Visit  Medication Sig Dispense Refill  . etonogestrel (NEXPLANON) 68 MG IMPL implant Inject 1 each into the skin once.      Marland Kitchen MYORISAN 40 MG capsule Take 1 capsule by mouth daily.      . Rivaroxaban (XARELTO) 20 MG TABS tablet Take 1 tablet (20 mg total) by mouth daily.  30 tablet  4   No current facility-administered medications on file prior to visit.    BP 92/60  Pulse 66  Wt 143 lb (64.864 kg)chart    Objective:   Physical Exam  Constitutional: She is oriented to person, place, and time. She appears well-developed and well-nourished.  Neck: Normal range of motion. Neck supple. No thyromegaly present.  Cardiovascular: Normal rate, regular rhythm and normal heart sounds.   Pulmonary/Chest: Effort normal and breath sounds normal.  Musculoskeletal: Normal range of motion.  Neurological: She is alert and oriented to person, place, and time.  Skin: Skin is warm and dry.  Psychiatric: She has a normal mood and affect.          Assessment & Plan:  Belinda Long was seen today  for depression.  Diagnoses and associated orders for this visit:  Depression  Anxiety state, unspecified  Other Orders - desvenlafaxine (PRISTIQ) 50 MG 24 hr tablet; Take 1 tablet (50 mg total) by mouth daily.   Call the office with any questions or concerns. Recheck as scheduled in 3 weeks and as needed.

## 2014-08-22 NOTE — Telephone Encounter (Signed)
Spoke with patient. Patient would like to schedule appointment to have nexplanon removed at this time. Appointment scheduled for 9/21 at 3:15pm with Dr.Silva. Patient agreeable to date and time. Order placed for removal.  Routing to provider for final review. Patient agreeable to disposition. Will close encounter

## 2014-08-28 ENCOUNTER — Ambulatory Visit (INDEPENDENT_AMBULATORY_CARE_PROVIDER_SITE_OTHER): Payer: BC Managed Care – PPO | Admitting: Physician Assistant

## 2014-08-28 ENCOUNTER — Encounter: Payer: Self-pay | Admitting: Physician Assistant

## 2014-08-28 VITALS — BP 110/72 | HR 65 | Temp 98.1°F | Resp 18 | Wt 141.2 lb

## 2014-08-28 DIAGNOSIS — J069 Acute upper respiratory infection, unspecified: Secondary | ICD-10-CM

## 2014-08-28 NOTE — Progress Notes (Signed)
Subjective:    Patient ID: Belinda Long, female    DOB: 03-Sep-1996, 18 y.o.   MRN: [id number]  URI  This is a new problem. The current episode started yesterday. The problem has been gradually worsening. There has been no fever. Associated symptoms include congestion, coughing (hourly, sputum), nausea, rhinorrhea, sinus pain, sneezing and vomiting. Pertinent negatives include no abdominal pain, chest pain, diarrhea, dysuria, ear pain, headaches, joint pain, joint swelling, neck pain, plugged ear sensation, rash, sore throat, swollen glands or wheezing. Treatments tried: Nyquil. The treatment provided no relief.      Review of Systems  Constitutional: Positive for chills. Negative for fever.  HENT: Positive for congestion, rhinorrhea and sneezing. Negative for ear pain, postnasal drip and sore throat.   Respiratory: Positive for cough (hourly, sputum). Negative for wheezing.   Cardiovascular: Negative for chest pain.  Gastrointestinal: Positive for nausea and vomiting. Negative for abdominal pain and diarrhea.  Genitourinary: Negative for dysuria.  Musculoskeletal: Negative for joint pain and neck pain.  Skin: Negative for rash.  Neurological: Negative for syncope and headaches.  All other systems reviewed and are negative.    Past Medical History  Diagnosis Date  . Dysmenorrhea in the adolescent   . Factor V deficiency   . 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 DVT, lower extremity, proximal, acute 06/07/2012    left - was on OCP- [facility]    History   Social History  . Marital Status: Single    Spouse Name: N/A    Number of Children: 0  . Years of Education: N/A   Occupational History  . Not on file.   Social History Main Topics  . Smoking status: Never Smoker   . Smokeless tobacco: Never Used  . Alcohol Use: No  . Drug Use: No  . Sexual Activity: Not Currently    Partners: Male    Birth Control/ Protection:  Abstinence   Other Topics Concern  . Not on file   Social History Narrative   Plays basketball and volleyball   [employer] going into 12th grade grades are good   HH of 3   3 dogs    Past Surgical History  Procedure Laterality Date  . Tonsillectomy      Family History  Problem Relation Age of Onset  . Cancer Maternal Grandmother     myeloma  . Hyperlipidemia Mother   . Depression Mother   . Factor V Leiden deficiency Father   . Prostate cancer Paternal Grandfather     No Known Allergies  Current Outpatient Prescriptions on File Prior to Visit  Medication Sig Dispense Refill  . desvenlafaxine (PRISTIQ) 50 MG 24 hr tablet Take 1 tablet (50 mg total) by mouth daily.  30 tablet  1  . etonogestrel (NEXPLANON) 68 MG IMPL implant Inject 1 each into the skin once.      Marland Kitchen MYORISAN 40 MG capsule Take 1 capsule by mouth daily.      . Rivaroxaban (XARELTO) 20 MG TABS tablet Take 1 tablet (20 mg total) by mouth daily.  30 tablet  4   No current facility-administered medications on file prior to visit.    EXAM: BP 110/72  Pulse 65  Temp(Src) 98.1 F (36.7 C) (Oral)  Resp 18  Wt 141 lb 3.2 oz (64.048 kg)  SpO2 98%  LMP 04/21/2014     Objective:   Physical Exam  Nursing note and vitals reviewed. Constitutional: She is oriented to person, place, and time. She appears well-developed and well-nourished. No distress.  HENT:  Head: Normocephalic and atraumatic.  Right Ear: External ear normal.  Left Ear: External ear normal.  Nose: Nose normal.  Mouth/Throat: Oropharynx is clear and moist. No oropharyngeal exudate.  Bilateral TMs normal. Bilateral frontal and maxillary sinuses non-TTP.  Eyes: Conjunctivae and EOM are normal. Pupils are equal, round, and reactive to light.  Neck: Normal range of motion. Neck supple.  Cardiovascular: Normal rate, regular rhythm and intact distal pulses.   Pulmonary/Chest: Effort normal and breath sounds normal. No stridor. No  respiratory distress. She has no wheezes. She has no rales. She exhibits no tenderness.  Abdominal: Soft. Bowel sounds are normal. There is no tenderness.  Lymphadenopathy:    She has no cervical adenopathy.  Neurological: She is alert and oriented to person, place, and time.  Skin: Skin is warm and dry. No rash noted. She is not diaphoretic. No pallor.  Psychiatric: She has a normal mood and affect. Her behavior is normal. Judgment and thought content normal.    Lab Results  Component Value Date   WBC 5.8 07/13/2012   HGB 14.2 07/13/2012   HCT 42.5 07/13/2012   PLT 224 07/13/2012   GLUCOSE 70 09/05/2013   ALT 16 09/05/2013   AST 22 09/05/2013   NA 141 09/05/2013   K 4.4 09/05/2013   CL 106 09/05/2013   CREATININE 1.1 09/05/2013   BUN 13 09/05/2013   CO2 29 09/05/2013   TSH 1.064 08/15/2014   INR 1.0 07/18/2013         Assessment & Plan:  Nirali was seen today for uri.  Diagnoses and associated orders for this visit:  Acute upper respiratory infections of unspecified site Comments: Symptomatic treatment with OTC Mucinex, nasal steroids, antihistamines, decongestant, push fluids, rest, watchful waiting.    Return precautions provided, and patient handout on URI.  Plan to follow up as needed, or for worsening or persistent symptoms despite treatment.  Patient Instructions  Plain Over the Counter Mucinex DM or Robitussin-DM for thick secretions and to help with cough.  Over the counter Sudafed to help with congestion.  Force NON dairy fluids, drinking plenty of water is best.    Over the Counter Flonase OR Nasacort AQ 1 spray in each nostril twice a day as needed. Use the "crossover" technique into opposite nostril spraying toward opposite ear @ 45 degree angle, not straight up into nostril.   Plain Over the Counter Allegra (NOT D )  160 daily , OR Loratidine 10 mg , OR Zyrtec 10 mg @ bedtime  as needed for itchy eyes & sneezing.  Saline Irrigation and Saline Sprays can also help  reduce symptoms.  If emergency symptoms discussed during visit developed, seek medical attention immediately.  Followup as needed, or for worsening or persistent symptoms despite treatment.

## 2014-08-28 NOTE — Progress Notes (Signed)
Pre visit review using our clinic review tool, if applicable. No additional management support is needed unless otherwise documented below in the visit note. 

## 2014-08-28 NOTE — Patient Instructions (Addendum)
Plain Over the Counter Mucinex DM or Robitussin-DM for thick secretions and to help with cough.  Over the counter Sudafed to help with congestion.  Force NON dairy fluids, drinking plenty of water is best.    Over the Counter Flonase OR Nasacort AQ 1 spray in each nostril twice a day as needed. Use the "crossover" technique into opposite nostril spraying toward opposite ear @ 45 degree angle, not straight up into nostril.   Plain Over the Counter Allegra (NOT D )  160 daily , OR Loratidine 10 mg , OR Zyrtec 10 mg @ bedtime  as needed for itchy eyes & sneezing.  Saline Irrigation and Saline Sprays can also help reduce symptoms.  If emergency symptoms discussed during visit developed, seek medical attention immediately.  Followup as needed, or for worsening or persistent symptoms despite treatment.     Upper Respiratory Infection, Adult An upper respiratory infection (URI) is also known as the common cold. It is often caused by a type of germ (virus). Colds are easily spread (contagious). You can pass it to others by kissing, coughing, sneezing, or drinking out of the same glass. Usually, you get better in 1 or 2 weeks.  HOME CARE   Only take medicine as told by your doctor.  Use a warm mist humidifier or breathe in steam from a hot shower.  Drink enough water and fluids to keep your pee (urine) clear or pale yellow.  Get plenty of rest.  Return to work when your temperature is back to normal or as told by your doctor. You may use a face mask and wash your hands to stop your cold from spreading. GET HELP RIGHT AWAY IF:   After the first few days, you feel you are getting worse.  You have questions about your medicine.  You have chills, shortness of breath, or brown or red spit (mucus).  You have yellow or brown snot (nasal discharge) or pain in the face, especially when you bend forward.  You have a fever, puffy (swollen) neck, pain when you swallow, or white spots in the back  of your throat.  You have a bad headache, ear pain, sinus pain, or chest pain.  You have a high-pitched whistling sound when you breathe in and out (wheezing).  You have a lasting cough or cough up blood.  You have sore muscles or a stiff neck. MAKE SURE YOU:   Understand these instructions.  Will watch your condition.  Will get help right away if you are not doing well or get worse. Document Released: 05/16/2008 Document Revised: 02/20/2012 Document Reviewed: 03/05/2014 [facility] Patient Information 2015 [place], [place]. This information is not intended to replace advice given to you by your health care provider. Make sure you discuss any questions you have with your health care provider.

## 2014-09-01 ENCOUNTER — Ambulatory Visit: Payer: BC Managed Care – PPO | Admitting: Obstetrics and Gynecology

## 2014-09-01 ENCOUNTER — Encounter: Payer: Self-pay | Admitting: Obstetrics and Gynecology

## 2014-09-01 ENCOUNTER — Telehealth: Payer: Self-pay | Admitting: Emergency Medicine

## 2014-09-01 NOTE — Telephone Encounter (Signed)
Message left to return call to [place] at [phone].   Need to confirm if patient is still taking xarelto. If still on Xarelto, will need to cancel appointment for today until our office can obtain instructions from her pcp regarding stopping and restarting xarelto for procedure.

## 2014-09-01 NOTE — Telephone Encounter (Signed)
PT [facility] appt for Nexplanon removal. Can you please rs this pt.

## 2014-09-01 NOTE — Telephone Encounter (Signed)
Called patient again. Did not leave message.

## 2014-09-02 ENCOUNTER — Telehealth: Payer: Self-pay | Admitting: Family

## 2014-09-02 NOTE — Telephone Encounter (Signed)
Dr Edward Jolly, the letter was already sent.   RE: No Show    Epes E Amundson de Gwenevere Ghazi, MD        Sent: Tue September 02, 2014 2:55 PM    To: Tiffany A Decker     Message     Triage will need to reach out to this patient for follow up.     Please coordinate with them before sending out the letter.        Thanks.    ----- Message -----    From: Clinton Quant    Sent: 09/01/2014 3:56 PM    To: Brook E Amundson de Gwenevere Ghazi, MD, *    Subject: No Show

## 2014-09-02 NOTE — Telephone Encounter (Signed)
OK. Thanks.

## 2014-09-02 NOTE — Telephone Encounter (Signed)
Belinda Long states pt come to office for contraception and would like the etonogestrel (NEXPLANON) 68 MG IMPL implant removed, they would like to know if pt can go off Xarelto, how many days prior, and how many days after the implant is removed to re-start??

## 2014-09-02 NOTE — Telephone Encounter (Signed)
Received incoming call from Beverely Low, CMA for NP Orvan Falconer. Advised that patient is non-compliant with Xarelto and although it is ordered for patient, patient self reports that she does not take this medication. Noted that patient has not requested refills in over one year.

## 2014-09-02 NOTE — Telephone Encounter (Signed)
Calling NP Campbell's office at [employer].  They are sending a message to NP Orvan Falconer to obtain recommendations for stopping of xarelto prior to removal and when to restart xarelto.

## 2014-09-02 NOTE — Telephone Encounter (Signed)
Belinda Long, although pt is prescribed Xarelto and should take it daily, she has made it clear that she does not take the medication

## 2014-09-02 NOTE — Telephone Encounter (Signed)
Thank you for the update!

## 2014-09-12 ENCOUNTER — Ambulatory Visit: Payer: BC Managed Care – PPO | Admitting: Family

## 2014-09-12 DIAGNOSIS — Z0289 Encounter for other administrative examinations: Secondary | ICD-10-CM

## 2014-09-15 ENCOUNTER — Telehealth: Payer: Self-pay | Admitting: Obstetrics and Gynecology

## 2014-09-15 NOTE — Telephone Encounter (Signed)
Spoke with patient. She would like to reschedule her Nexplanon removal appointment.  Patient states she does take Xarelto every day and that she usually stops one week prior to appointment. I advised that our office reached out to NP Campbell's office for instructions on starting and stopping xarelto prior to removal. Advised that we need those instructions prior to appointment. Patient has appointment tomorrow with NP Orvan Falconerampbell and I requested that she discuss with NP Orvan Falconerampbell and that we need to see instructions prior to appointment. Advised patient not to stop Xarelto without instructions from NP Campell. Patient verbalized understanding.   Patient is scheduled for nexplanon removal 10/03/14 at 1000.

## 2014-09-15 NOTE — Telephone Encounter (Signed)
Thank you for the update, and we will await final instructions regarding the Xarelto. I will be happy to remove the Nexplanon.

## 2014-09-15 NOTE — Telephone Encounter (Signed)
Patient calling to reschedule her Nexplanon removal appointment that she missed.

## 2014-09-16 ENCOUNTER — Ambulatory Visit (INDEPENDENT_AMBULATORY_CARE_PROVIDER_SITE_OTHER): Payer: BC Managed Care – PPO | Admitting: Family

## 2014-09-16 ENCOUNTER — Encounter: Payer: Self-pay | Admitting: Family

## 2014-09-16 VITALS — HR 67 | Temp 98.4°F | Wt 143.0 lb

## 2014-09-16 DIAGNOSIS — F331 Major depressive disorder, recurrent, moderate: Secondary | ICD-10-CM

## 2014-09-16 DIAGNOSIS — R1110 Vomiting, unspecified: Secondary | ICD-10-CM

## 2014-09-16 DIAGNOSIS — J301 Allergic rhinitis due to pollen: Secondary | ICD-10-CM

## 2014-09-16 DIAGNOSIS — A084 Viral intestinal infection, unspecified: Secondary | ICD-10-CM

## 2014-09-16 LAB — CBC WITH DIFFERENTIAL/PLATELET
BASOS ABS: 0 10*3/uL (ref 0.0–0.1)
Basophils Relative: 0.6 % (ref 0.0–3.0)
EOS PCT: 1.5 % (ref 0.0–5.0)
Eosinophils Absolute: 0.1 10*3/uL (ref 0.0–0.7)
HCT: 41.2 % (ref 36.0–46.0)
Hemoglobin: 14 g/dL (ref 12.0–15.0)
Lymphocytes Relative: 40.7 % (ref 12.0–46.0)
Lymphs Abs: 2.6 10*3/uL (ref 0.7–4.0)
MCHC: 34 g/dL (ref 30.0–36.0)
MCV: 92.9 fl (ref 78.0–100.0)
MONOS PCT: 9.5 % (ref 3.0–12.0)
Monocytes Absolute: 0.6 10*3/uL (ref 0.1–1.0)
NEUTROS PCT: 47.7 % (ref 43.0–77.0)
Neutro Abs: 3 10*3/uL (ref 1.4–7.7)
PLATELETS: 217 10*3/uL (ref 150.0–400.0)
RBC: 4.43 Mil/uL (ref 3.87–5.11)
RDW: 12.7 % (ref 11.5–14.6)
WBC: 6.3 10*3/uL (ref 4.5–10.5)

## 2014-09-16 LAB — COMPREHENSIVE METABOLIC PANEL
ALBUMIN: 4.3 g/dL (ref 3.5–5.2)
ALT: 16 U/L (ref 0–35)
AST: 22 U/L (ref 0–37)
Alkaline Phosphatase: 55 U/L (ref 39–117)
BILIRUBIN TOTAL: 0.7 mg/dL (ref 0.2–1.2)
BUN: 16 mg/dL (ref 6–23)
CO2: 29 meq/L (ref 19–32)
Calcium: 8.8 mg/dL (ref 8.4–10.5)
Chloride: 103 mEq/L (ref 96–112)
Creatinine, Ser: 0.9 mg/dL (ref 0.4–1.2)
GFR: 82.37 mL/min (ref >60.00)
Glucose, Bld: 79 mg/dL (ref 70–99)
POTASSIUM: 3.8 meq/L (ref 3.5–5.1)
SODIUM: 138 meq/L (ref 135–145)
Total Protein: 7.1 g/dL (ref 6.0–8.3)

## 2014-09-16 LAB — POCT URINALYSIS DIPSTICK
BILIRUBIN UA: NEGATIVE
Glucose, UA: NEGATIVE
KETONES UA: NEGATIVE
Nitrite, UA: NEGATIVE
PROTEIN UA: NEGATIVE
RBC UA: NEGATIVE
Spec Grav, UA: 1.015
Urobilinogen, UA: 0.2
pH, UA: 6.5

## 2014-09-16 MED ORDER — DESVENLAFAXINE SUCCINATE ER 50 MG PO TB24: 50.0000 mg | ORAL_TABLET | Freq: Every day | ORAL | Status: DC

## 2014-09-16 NOTE — Progress Notes (Signed)
Subjective:    Patient ID: Belinda Long, female    DOB: 09-29-1996, 18 y.o.   MRN: [id number]  HPI 18 year old white female, nonsmoker, is in today with c/o nausea, vomiting, diarrhea x 5 days. Reports having cough, congestion, and sinus drainage. Has not been taking any medication OTC. Has had a low grade fever of about 100. Symptoms are not improving at this point.    Review of Systems  Constitutional: Positive for fever. Negative for chills.  HENT: Positive for postnasal drip and sore throat.   Respiratory: Negative.   Cardiovascular: Negative.   Gastrointestinal: Positive for nausea, vomiting and diarrhea.  Endocrine: Negative.   Genitourinary: Negative.  Negative for dysuria.  Musculoskeletal: Negative.   Skin: Negative.   Allergic/Immunologic: Negative.   Neurological: Negative.   Hematological: Negative.   Psychiatric/Behavioral: Negative.    Past Medical History  Diagnosis Date  . Dysmenorrhea in the adolescent   . Factor V deficiency   . 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 DVT, lower extremity, proximal, acute 06/07/2012    left - was on OCP- [facility]    History   Social History  . Marital Status: Single    Spouse Name: N/A    Number of Children: 0  . Years of Education: N/A   Occupational History  . Not on file.   Social History Main Topics  . Smoking status: Never Smoker   . Smokeless tobacco: Never Used  . Alcohol Use: No  . Drug Use: No  . Sexual Activity: Not Currently    Partners: Male    Birth Control/ Protection: Abstinence   Other Topics Concern  . Not on file   Social History Narrative   Plays basketball and volleyball   [employer] going into 12th grade grades are good   HH of 3   3 dogs    Past Surgical History  Procedure Laterality Date  . Tonsillectomy      Family History  Problem Relation Age of Onset  . Cancer Maternal Grandmother     myeloma  .  Hyperlipidemia Mother   . Depression Mother   . Factor V Leiden deficiency Father   . Prostate cancer Paternal Grandfather     No Known Allergies  Current Outpatient Prescriptions on File Prior to Visit  Medication Sig Dispense Refill  . etonogestrel (NEXPLANON) 68 MG IMPL implant Inject 1 each into the skin once.      Marland Kitchen MYORISAN 40 MG capsule Take 1 capsule by mouth daily.      . Rivaroxaban (XARELTO) 20 MG TABS tablet Take 1 tablet (20 mg total) by mouth daily.  30 tablet  4   No current facility-administered medications on file prior to visit.    Pulse 67  Temp(Src) 98.4 F (36.9 C) (Oral)  Wt 143 lb (64.864 kg)  LMP 05/11/2015chart    Objective:   Physical Exam  Constitutional: She is oriented to person, place, and time. She appears well-developed and well-nourished.  HENT:  Right Ear: External ear normal.  Left Ear: External ear normal.  Nose: Nose normal.  Mouth/Throat: Oropharynx is clear and moist.  Neck: Normal range of motion. Neck supple.  Cardiovascular: Normal rate, regular rhythm and normal heart sounds.   Pulmonary/Chest: Effort normal and breath sounds normal.  Abdominal: Soft. Bowel sounds are normal.  Musculoskeletal: Normal range of motion.  Neurological: She is alert and  oriented to person, place, and time.  Skin: Skin is warm and dry.  Psychiatric: She has a normal mood and affect.          Assessment & Plan:  Belinda Long was seen today for diarrhea, emesis, nasal congestion and sore throat.  Diagnoses and associated orders for this visit:  Viral gastroenteritis - CBC with Differential - CMP - POCT urinalysis dipstick  Hay fever  Intractable vomiting with nausea, vomiting of unspecified type - POCT urinalysis dipstick  Major depressive disorder, recurrent episode, moderate  Other Orders - desvenlafaxine (PRISTIQ) 50 MG 24 hr tablet; Take 1 tablet (50 mg total) by mouth daily.   Call the office with any questions or concerns. Recheck as  scheduled and as needed.  Increase intake of fluids, Gatorade, Pedialyte.

## 2014-09-16 NOTE — Patient Instructions (Signed)

## 2014-09-24 ENCOUNTER — Telehealth: Payer: Self-pay | Admitting: Family

## 2014-09-24 NOTE — Telephone Encounter (Signed)
They would like instructions on how to DC pt's Xarelto prior to her Nexplanon removal on 10/23.

## 2014-09-24 NOTE — Telephone Encounter (Signed)
Calling [phone] to send message to NP Orvan Falconerampbell for instructions re: DC Xarelto for Nexplanon Removal.

## 2014-09-25 NOTE — Telephone Encounter (Signed)
French Anaracy aware, per Oran ReinPadonda, pt should d/c Xarelto the day before procedure and resume the evening of procedure. Therefore pt will miss one dose of Xarelto. French Anaracy verbalized understanding

## 2014-09-25 NOTE — Telephone Encounter (Signed)
Received incoming call from Beverely Lowamesha L Frazier, CMA for NP Orvan Falconerampbell.  Patient to hold xarelto the day before the nexplanon removal and then restart the evening of removal.   Dr. Edward JollySilva, okay to advise patient of these instructions?

## 2014-09-26 NOTE — Telephone Encounter (Signed)
Ok to proceed as outlined by the PCP regarding Xarelto.

## 2014-09-30 NOTE — Telephone Encounter (Signed)
Message left to return call to Laqueta Bonaventura at [phone].    

## 2014-10-02 NOTE — Telephone Encounter (Signed)
Detailed message left okay per designated party release form.  Advised to ensure to hold xarelto today and hold tomorrow and until after appointment with Dr. Edward JollySilva with further instructions.  Advised patient if cannot keep appointment for tomorrow to please call our office as soon as possible.  Detailed message left. Will close encounter.  Routing to provider for final review. Patient agreeable to disposition. Will close encounter

## 2014-10-03 ENCOUNTER — Encounter: Payer: Self-pay | Admitting: Obstetrics and Gynecology

## 2014-10-03 ENCOUNTER — Ambulatory Visit (INDEPENDENT_AMBULATORY_CARE_PROVIDER_SITE_OTHER): Payer: BC Managed Care – PPO | Admitting: Obstetrics and Gynecology

## 2014-10-03 VITALS — BP 102/60 | HR 80 | Resp 16 | Ht 67.0 in | Wt 141.0 lb

## 2014-10-03 DIAGNOSIS — Z3046 Encounter for surveillance of implantable subdermal contraceptive: Secondary | ICD-10-CM

## 2014-10-03 DIAGNOSIS — Z308 Encounter for other contraceptive management: Secondary | ICD-10-CM

## 2014-10-03 NOTE — Progress Notes (Signed)
GYNECOLOGY  VISIT   HPI: 18 y.o.   Single  Caucasian  female   G0P0 with No LMP recorded. Patient has had an implant.   here for   Nexplanon removal. Does not want anything implantable at this time.  Has been concerned about acne and weight gain also.  Stopped Xarelto October 11.  GYNECOLOGIC HISTORY: No LMP recorded. Patient has had an implant. Contraception:  Nexplanon  Menopausal hormone therapy: None        OB History   Grav Para Term Preterm Abortions TAB SAB Ect Mult Living   0                  Patient Active Problem List   Diagnosis Date Noted  . Perleche 03/21/2013  . 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 Adjustment disorder with depressed mood 09/19/2012  . Body image problem 09/19/2012  . Anemia 08/14/2012  . DVT (deep venous thrombosis) 06/22/2012  . Chronic anticoagulation 06/22/2012  . Encounter for monitoring coumadin therapy 06/12/2012  . Heterozygous factor V Leiden mutation 06/12/2012  . Wears glasses 07/06/2011  . Left wrist injury 07/06/2011  . Well adolescent visit 07/06/2011  . PRIMARY DYSMENORRHEA 08/10/2010  . KNEE PAIN, BILATERAL 08/10/2010  . ALLERGIC RHINITIS 03/19/2010  . ACNE 10/17/2007    Past Medical History  Diagnosis Date  . Dysmenorrhea in the adolescent   . Factor V deficiency   . 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 DVT, lower extremity, proximal, acute 06/07/2012    left - was on OCP- [facility]    Past Surgical History  Procedure Laterality Date  . Tonsillectomy      Current Outpatient Prescriptions  Medication Sig Dispense Refill  . buPROPion (WELLBUTRIN XL) 300 MG 24 hr tablet       . desvenlafaxine (PRISTIQ) 50 MG 24 hr tablet Take 1 tablet (50 mg total) by mouth daily.  30 tablet  4  . etonogestrel (NEXPLANON) 68 MG IMPL implant Inject 1 each into the skin once.      Marland Kitchen. MYORISAN 40 MG capsule Take 1 capsule by mouth daily.      . Rivaroxaban  (XARELTO) 20 MG TABS tablet Take 1 tablet (20 mg total) by mouth daily.  30 tablet  4   No current facility-administered medications for this visit.     ALLERGIES: Review of patient's allergies indicates no known allergies.  Family History  Problem Relation Age of Onset  . Cancer Maternal Grandmother     myeloma  . Hyperlipidemia Mother   . Depression Mother   . Factor V Leiden deficiency Father   . Prostate cancer Paternal Grandfather     History   Social History  . Marital Status: Single    Spouse Name: N/A    Number of Children: 0  . Years of Education: N/A   Occupational History  . Not on file.   Social History Main Topics  . Smoking status: Never Smoker   . Smokeless tobacco: Never Used  . Alcohol Use: No  . Drug Use: No  . Sexual Activity: Not Currently    Partners: Male    Birth Control/ Protection: Abstinence   Other Topics Concern  . Not on file   Social History Narrative   Plays basketball and volleyball   [employer] going into 12th grade grades are good   HH of 3   3 dogs  ROS:  Pertinent items are noted in HPI.  PHYSICAL EXAMINATION:    BP 102/60  Pulse 80  Resp 16  Ht 5\' 7"  (1.702 m)  Wt 141 lb (63.957 kg)  BMI 22.08 kg/m2     General appearance: alert, cooperative and appears stated age  Nexplanon removal. Consent for procedure.  Nexplanon identified in left arm.  Sterile prep with betadine.  Local with 1% lidocaine, 1 cc.  Lot number _[id number]_, expiration __6/1/16__. Incision created with scalpel over site of Nexplanon insertion scar. Nexplanon removed without difficulty.  Bandage placed. Minimal EBL.  No complications.  ASSESSMENT  Nexplanon removal.  History of DVT. Factor V Leiden.   PLAN  Instructions and precautions given. Knows she is not protected against pregnancy if is sexually active.  Restart Xarelto in 24 hours.  Follow up for annual exam and prn.   An After Visit Summary was printed and given to  the patient.  __10____ minutes face to face time of which over 50% was spent in counseling.

## 2014-10-03 NOTE — Patient Instructions (Signed)
Call for fever, redness or streaking of your arm, or any significant bleeding or pain.

## 2014-10-15 ENCOUNTER — Ambulatory Visit: Payer: BC Managed Care – PPO | Admitting: Family

## 2014-10-15 DIAGNOSIS — Z0289 Encounter for other administrative examinations: Secondary | ICD-10-CM

## 2015-08-25 ENCOUNTER — Telehealth: Payer: Self-pay | Admitting: Family Medicine

## 2015-08-25 ENCOUNTER — Encounter: Payer: Self-pay | Admitting: Family Medicine

## 2015-08-25 ENCOUNTER — Ambulatory Visit: Payer: Self-pay | Admitting: Family Medicine

## 2015-08-25 ENCOUNTER — Ambulatory Visit (INDEPENDENT_AMBULATORY_CARE_PROVIDER_SITE_OTHER): Payer: 59 | Admitting: Family Medicine

## 2015-08-25 VITALS — BP 98/62 | HR 90 | Temp 98.1°F | Ht 67.0 in | Wt 145.0 lb

## 2015-08-25 DIAGNOSIS — R1032 Left lower quadrant pain: Secondary | ICD-10-CM | POA: Diagnosis not present

## 2015-08-25 DIAGNOSIS — K529 Noninfective gastroenteritis and colitis, unspecified: Secondary | ICD-10-CM

## 2015-08-25 LAB — BASIC METABOLIC PANEL
BUN: 13 mg/dL (ref 6–23)
CALCIUM: 8.9 mg/dL (ref 8.4–10.5)
CO2: 30 mEq/L (ref 19–32)
Chloride: 104 mEq/L (ref 96–112)
Creatinine, Ser: 0.86 mg/dL (ref 0.40–1.20)
GFR: 88.23 mL/min (ref >60.00)
GLUCOSE: 111 mg/dL — AB (ref 70–99)
POTASSIUM: 4.9 meq/L (ref 3.5–5.1)
SODIUM: 139 meq/L (ref 135–145)

## 2015-08-25 LAB — CBC WITH DIFFERENTIAL/PLATELET
BASOS ABS: 0 10*3/uL (ref 0.0–0.1)
BASOS PCT: 0.4 % (ref 0.0–3.0)
EOS ABS: 0 10*3/uL (ref 0.0–0.7)
Eosinophils Relative: 0.2 % (ref 0.0–5.0)
HEMATOCRIT: 42.1 % (ref 36.0–46.0)
HEMOGLOBIN: 14.2 g/dL (ref 12.0–15.0)
LYMPHS PCT: 12.4 % (ref 12.0–46.0)
Lymphs Abs: 1.4 10*3/uL (ref 0.7–4.0)
MCHC: 33.7 g/dL (ref 30.0–36.0)
MCV: 93.8 fl (ref 78.0–100.0)
MONOS PCT: 6.5 % (ref 3.0–12.0)
Monocytes Absolute: 0.8 10*3/uL (ref 0.1–1.0)
Neutro Abs: 9.4 10*3/uL — ABNORMAL HIGH (ref 1.4–7.7)
Neutrophils Relative %: 80.5 % — ABNORMAL HIGH (ref 43.0–77.0)
Platelets: 257 10*3/uL (ref 150.0–400.0)
RBC: 4.49 Mil/uL (ref 3.87–5.11)
RDW: 13.4 % (ref 11.5–15.5)
WBC: 11.6 10*3/uL — AB (ref 4.0–10.5)

## 2015-08-25 LAB — LIPASE: Lipase: 19 U/L (ref 11.0–59.0)

## 2015-08-25 LAB — POCT URINALYSIS DIPSTICK
Blood, UA: NEGATIVE
Glucose, UA: NEGATIVE
Ketones, UA: NEGATIVE
LEUKOCYTES UA: NEGATIVE
Nitrite, UA: NEGATIVE
Spec Grav, UA: >=1.03
UROBILINOGEN UA: 0.2
pH, UA: 5.5

## 2015-08-25 LAB — POCT MONO (EPSTEIN BARR VIRUS): Mono, POC: NEGATIVE

## 2015-08-25 LAB — HEPATIC FUNCTION PANEL
ALBUMIN: 4.3 g/dL (ref 3.5–5.2)
ALK PHOS: 55 U/L (ref 39–117)
ALT: 14 U/L (ref 0–35)
AST: 17 U/L (ref 0–37)
BILIRUBIN TOTAL: 0.4 mg/dL (ref 0.2–1.2)
Bilirubin, Direct: 0.1 mg/dL (ref 0.0–0.3)
Total Protein: 6.7 g/dL (ref 6.0–8.3)

## 2015-08-25 LAB — MONONUCLEOSIS SCREEN: MONO SCREEN: POSITIVE — AB

## 2015-08-25 LAB — AMYLASE: AMYLASE: 62 U/L (ref 27–131)

## 2015-08-25 LAB — POCT INFLUENZA A/B
INFLUENZA A, POC: NEGATIVE
Influenza B, POC: NEGATIVE

## 2015-08-25 MED ORDER — METRONIDAZOLE 500 MG PO TABS: 500.0000 mg | ORAL_TABLET | Freq: Three times a day (TID) | ORAL | Status: DC

## 2015-08-25 MED ORDER — ONDANSETRON HCL 8 MG PO TABS: 8.0000 mg | ORAL_TABLET | Freq: Three times a day (TID) | ORAL | Status: DC | PRN

## 2015-08-25 MED ORDER — CIPROFLOXACIN HCL 500 MG PO TABS: 500.0000 mg | ORAL_TABLET | Freq: Two times a day (BID) | ORAL | Status: DC

## 2015-08-25 NOTE — Progress Notes (Signed)
Pre visit review using our clinic review tool, if applicable. No additional management support is needed unless otherwise documented below in the visit note. 

## 2015-08-25 NOTE — Addendum Note (Signed)
Addended by: Bonnye Fava on: 08/25/2015 12:45 PM   Modules accepted: Orders

## 2015-08-25 NOTE — Addendum Note (Signed)
Addended by: Gershon Crane A on: 08/25/2015 01:27 PM   Modules accepted: Orders

## 2015-08-25 NOTE — Progress Notes (Signed)
   Subjective:    Patient ID: Belinda Long, female    DOB: 12/27/1995, 19 y.o.   MRN: [id number]  HPI Here with her mother for diarrhea, LLQ abdominal pains, chills, body aches, and a headache. She is very nauseated but has not vomited. No ST or coughing. No UTI symptoms. The diarrhea started 3 days ago but she felt reasonably well, however when she awoke this am all the other symptoms were present. She feels very weak and lightheaded. No recent travel. None of her roommates are sick.    Review of Systems  Constitutional: Positive for chills, diaphoresis and fatigue. Negative for fever.  Eyes: Negative.   Respiratory: Negative.   Cardiovascular: Negative.   Gastrointestinal: Positive for nausea, abdominal pain and diarrhea. Negative for vomiting, constipation, blood in stool, abdominal distention, anal bleeding and rectal pain.  Genitourinary: Negative.   Neurological: Positive for light-headedness and headaches. Negative for dizziness, tremors, seizures, syncope, facial asymmetry, speech difficulty, weakness and numbness.       Objective:   Physical Exam  Constitutional: She is oriented to person, place, and time.  Alert but appears ill, weak  HENT:  Right Ear: External ear normal.  Left Ear: External ear normal.  Nose: Nose normal.  Mouth/Throat: Oropharynx is clear and moist.  Eyes: Conjunctivae are normal. No scleral icterus.  Neck: Neck supple. No thyromegaly present.  Cardiovascular: Normal rate, regular rhythm, normal heart sounds and intact distal pulses.   Pulmonary/Chest: Effort normal and breath sounds normal.  Abdominal: Soft. Bowel sounds are normal. She exhibits no distension and no mass. There is no rebound and no guarding.  Moderately tender in the left upper and lower quadrants, more so in the LLQ   Lymphadenopathy:    She has no cervical adenopathy.  Neurological: She is alert and oriented to person, place, and time.          Assessment & Plan:    Enteritis, possibly food poisoning. She tested negative today for mono, influenza, and UTI . Treat with Cipro and Flagyl. Drink plenty of fluids. Use Zofran prn nausea. Await other labs drawn today. She was excused from college classes today and tomorrow.

## 2015-08-25 NOTE — Telephone Encounter (Signed)
I left a voice message for pt to come by the office for a lab draw, the order is in computer.

## 2015-08-26 ENCOUNTER — Other Ambulatory Visit (INDEPENDENT_AMBULATORY_CARE_PROVIDER_SITE_OTHER): Payer: 59

## 2015-08-26 ENCOUNTER — Telehealth: Payer: Self-pay | Admitting: Family

## 2015-08-26 DIAGNOSIS — R1032 Left lower quadrant pain: Secondary | ICD-10-CM

## 2015-08-26 NOTE — Telephone Encounter (Signed)
I spoke with pt and went over results. 

## 2015-08-26 NOTE — Telephone Encounter (Signed)
Pt is coming to lab today. Mom said pt ate a little food last nigh. Pt is having left and right side pain now. Pt has ua test yesterday. Please advise

## 2015-08-26 NOTE — Telephone Encounter (Signed)
Pt is on lab schedule for today 08/26/15.

## 2015-08-26 NOTE — Telephone Encounter (Signed)
Previous lab results are back, call pt and go over.

## 2015-08-27 LAB — EPSTEIN-BARR VIRUS VCA, IGG: EBV VCA IgG: 195 U/mL — ABNORMAL HIGH (ref <18.0)

## 2015-08-27 LAB — EPSTEIN-BARR VIRUS VCA, IGM: EBV VCA IGM: 14.4 U/mL (ref <36.0)

## 2015-11-20 ENCOUNTER — Telehealth: Payer: Self-pay | Admitting: Family Medicine

## 2015-11-20 ENCOUNTER — Ambulatory Visit: Payer: 59 | Admitting: Family Medicine

## 2015-11-20 DIAGNOSIS — Z0289 Encounter for other administrative examinations: Secondary | ICD-10-CM

## 2015-11-20 NOTE — Telephone Encounter (Signed)
Called patient and mother answered. Steward DroneBrenda (mother; confirmed on DPR), stated she is surprised her daughter did not show up for appointment. She plans to further get intouch with patient and updated office if/when information can be provided about absence.

## 2015-11-20 NOTE — Telephone Encounter (Signed)
I tried to reach pt and no answer. Pt was on schedule today to see Dr. Clent RidgesFry. Can we follow up and try to reach pt again due to reason that is listed on today's visit?

## 2015-12-16 ENCOUNTER — Telehealth: Payer: Self-pay | Admitting: Family

## 2015-12-16 NOTE — Telephone Encounter (Signed)
TELEPHONE ADVICE RECORD [facility]  Patient Name: Lou CalMEGAN Savino  DOB: 09/10/1996    Initial Comment Caller states I need to make an appointment Have sharp pain in my leg    Nurse Assessment  Nurse: Odis LusterBowers, RN, Bjorn Loserhonda Date/Time (Eastern Time): 12/16/2015 4:01:57 PM  Confirm and document reason for call. If symptomatic, describe symptoms. ---Caller states I need to make an appointment Have sharp pain in my leg. Off/on for a couple of months. L leg, sharp pain on the inner thigh, near groin to her knee. Able to ambulate. Rate pain= 5/10, intermittent. Reports that it is worse when she lies down and night and after she works out. Has a hx of blood clots in the leg.  Has the patient traveled out of the country within the last 30 days? ---No  Does the patient have any new or worsening symptoms? ---Yes  Will a triage be completed? ---Yes  Related visit to physician within the last 2 weeks? ---No  Does the PT have any chronic conditions? (i.e. diabetes, asthma, etc.) ---Yes  List chronic conditions. ---hx blood clots; Factor 5  Did the patient indicate they were pregnant? ---No  Is this a behavioral health or substance abuse call? ---No     Guidelines    Guideline Title Affirmed Question Affirmed Notes  Leg Pain Chest Pain    Final Disposition User   Go to ED Now Odis LusterBowers, RN, Rhonda    [facility]   Disagree/Comply: Comply

## 2015-12-17 ENCOUNTER — Ambulatory Visit (INDEPENDENT_AMBULATORY_CARE_PROVIDER_SITE_OTHER): Payer: 59 | Admitting: Family Medicine

## 2015-12-17 ENCOUNTER — Telehealth: Payer: Self-pay | Admitting: Family

## 2015-12-17 VITALS — BP 90/70 | HR 95 | Temp 97.3°F | Ht 67.0 in | Wt 149.6 lb

## 2015-12-17 DIAGNOSIS — Z86718 Personal history of other venous thrombosis and embolism: Secondary | ICD-10-CM | POA: Diagnosis not present

## 2015-12-17 DIAGNOSIS — Z23 Encounter for immunization: Secondary | ICD-10-CM

## 2015-12-17 DIAGNOSIS — M79652 Pain in left thigh: Secondary | ICD-10-CM | POA: Diagnosis not present

## 2015-12-17 NOTE — Telephone Encounter (Signed)
Patient has appointment at 11:45 with Dr. Caryl NeverBurchette today

## 2015-12-17 NOTE — Telephone Encounter (Signed)
I will have Dr. Caryl NeverBurchette address this today since he is seeing her

## 2015-12-17 NOTE — Progress Notes (Signed)
   Subjective:    Patient ID: Belinda Long, female    DOB: 09/30/1996, 20 y.o.   MRN: [id number]  HPI Patient seen with approximately 2 month history of left thigh pain. Denies injury. Location of pain is medial thigh proximal to about midway down. Denies any calf pain. No leg edema. She had DVT left lower extremity back in 2013. 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 She also had been taking oral contraceptives at that time and is of course no longer on those. Nonsmoker.  Denies any recent leg edema. Pain is relatively mild. She is continuing to run without difficulty. She has some direct tenderness over the medial thigh. No erythema. No warmth. Denies any dyspnea or chest pain at this time. No pleuritic pain.  Past Medical History  Diagnosis Date  . Dysmenorrhea in the adolescent   . Factor V deficiency   . 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 DVT, lower extremity, proximal, acute 06/07/2012    left - was on OCP- Yaz   Past Surgical History  Procedure Laterality Date  . Tonsillectomy      reports that she has never smoked. She has never used smokeless tobacco. She reports that she does not drink alcohol or use illicit drugs. family history includes Cancer in her maternal grandmother; Depression in her mother; Factor V Leiden deficiency in her father; Hyperlipidemia in her mother; Prostate cancer in her paternal grandfather. No Known Allergies    Review of Systems  Constitutional: Negative for appetite change, fatigue and unexpected weight change.  Eyes: Negative for visual disturbance.  Respiratory: Negative for cough, chest tightness, shortness of breath and wheezing.   Cardiovascular: Negative for chest pain, palpitations and leg swelling.  Gastrointestinal: Negative for abdominal pain.  Neurological: Negative for dizziness, seizures, syncope, weakness,  light-headedness and headaches.       Objective:   Physical Exam  Constitutional: She appears well-developed and well-nourished. No distress.  Neck: Neck supple. No thyromegaly present.  Cardiovascular: Normal rate and regular rhythm.   Pulmonary/Chest: Effort normal and breath sounds normal. No respiratory distress. She has no wheezes. She has no rales.  Musculoskeletal: She exhibits no edema.  Left leg reveals no edema. No color changes. Excellent distal pulses. No palpated masses or induration. Excellent range of motion left hip. No reproducible pain  Neurological:  Full strength throughout left lower extremity          Assessment & Plan:  Left medial thigh pain. Past history of DVT with known factor V deficiency. She does not have anything clinically to suggest likely acute/recurrent DVT. Suspect groin strain. Check d-dimer. If positive venous Doppler.  If negative treat for probable muscle sprain

## 2015-12-17 NOTE — Progress Notes (Signed)
Pre visit review using our clinic review tool, if applicable. No additional management support is needed unless otherwise documented below in the visit note. 

## 2015-12-17 NOTE — Telephone Encounter (Signed)
Pt Mom call to ask for a referral to see a Hematology and to ask for a Ultra Sound on pt legs to see if the blood clots has return. She said pt saw you and that you were aware of pt conditions.

## 2015-12-18 LAB — D-DIMER, QUANTITATIVE (NOT AT ARMC): D DIMER QUANT: 0.34 ug{FEU}/mL (ref 0.00–0.48)

## 2017-01-14 ENCOUNTER — Encounter (HOSPITAL_COMMUNITY): Payer: Self-pay

## 2017-01-14 DIAGNOSIS — J029 Acute pharyngitis, unspecified: Secondary | ICD-10-CM | POA: Insufficient documentation

## 2017-01-14 DIAGNOSIS — R51 Headache: Secondary | ICD-10-CM | POA: Diagnosis present

## 2017-01-14 DIAGNOSIS — B349 Viral infection, unspecified: Secondary | ICD-10-CM | POA: Diagnosis not present

## 2017-01-14 LAB — RAPID STREP SCREEN (MED CTR MEBANE ONLY): STREPTOCOCCUS, GROUP A SCREEN (DIRECT): NEGATIVE

## 2017-01-14 NOTE — ED Triage Notes (Signed)
Onset 3 days ago sore throat, white tiny bumps on end of tongue, headache, and lower back pain.  No cough/cold symptoms,urinary symptoms. N/V/D.

## 2017-01-15 ENCOUNTER — Emergency Department (HOSPITAL_COMMUNITY)
Admission: EM | Admit: 2017-01-15 | Discharge: 2017-01-15 | Disposition: A | Discharge status: Home / Self Care | Payer: 59 | Attending: Emergency Medicine | Admitting: Emergency Medicine

## 2017-01-15 DIAGNOSIS — J029 Acute pharyngitis, unspecified: Secondary | ICD-10-CM

## 2017-01-15 DIAGNOSIS — B349 Viral infection, unspecified: Secondary | ICD-10-CM

## 2017-01-15 LAB — CBC WITH DIFFERENTIAL/PLATELET
BASOS PCT: 0 %
Basophils Absolute: 0 10*3/uL (ref 0.0–0.1)
Eosinophils Absolute: 0 10*3/uL (ref 0.0–0.7)
Eosinophils Relative: 0 %
HEMATOCRIT: 41.4 % (ref 36.0–46.0)
HEMOGLOBIN: 13.6 g/dL (ref 12.0–15.0)
LYMPHS ABS: 1.7 10*3/uL (ref 0.7–4.0)
LYMPHS PCT: 17 %
MCH: 31.1 pg (ref 26.0–34.0)
MCHC: 32.9 g/dL (ref 30.0–36.0)
MCV: 94.7 fL (ref 78.0–100.0)
MONO ABS: 1.1 10*3/uL — AB (ref 0.1–1.0)
MONOS PCT: 11 %
NEUTROS ABS: 6.9 10*3/uL (ref 1.7–7.7)
NEUTROS PCT: 72 %
Platelets: 206 10*3/uL (ref 150–400)
RBC: 4.37 MIL/uL (ref 3.87–5.11)
RDW: 12.8 % (ref 11.5–15.5)
WBC: 9.7 10*3/uL (ref 4.0–10.5)

## 2017-01-15 LAB — URINALYSIS, ROUTINE W REFLEX MICROSCOPIC
Bilirubin Urine: NEGATIVE
GLUCOSE, UA: NEGATIVE mg/dL
Hgb urine dipstick: NEGATIVE
Ketones, ur: 5 mg/dL — AB
LEUKOCYTES UA: NEGATIVE
NITRITE: NEGATIVE
PH: 6 (ref 5.0–8.0)
Protein, ur: NEGATIVE mg/dL
SPECIFIC GRAVITY, URINE: 1.019 (ref 1.005–1.030)

## 2017-01-15 LAB — BASIC METABOLIC PANEL
Anion gap: 10 (ref 5–15)
BUN: 15 mg/dL (ref 6–20)
CHLORIDE: 101 mmol/L (ref 101–111)
CO2: 25 mmol/L (ref 22–32)
CREATININE: 0.97 mg/dL (ref 0.44–1.00)
Calcium: 8.9 mg/dL (ref 8.9–10.3)
GFR calc non Af Amer: >60 mL/min (ref >60)
GLUCOSE: 99 mg/dL (ref 65–99)
Potassium: 4.1 mmol/L (ref 3.5–5.1)
Sodium: 136 mmol/L (ref 135–145)

## 2017-01-15 LAB — MONONUCLEOSIS SCREEN: MONO SCREEN: NEGATIVE

## 2017-01-15 LAB — PREGNANCY, URINE: Preg Test, Ur: NEGATIVE

## 2017-01-15 MED ORDER — IBUPROFEN 600 MG PO TABS: 600.0000 mg | ORAL_TABLET | Freq: Four times a day (QID) | ORAL | 0 refills | Status: DC | PRN

## 2017-01-15 MED ORDER — SODIUM CHLORIDE 0.9 % IV BOLUS (SEPSIS)
1000.0000 mL | Freq: Once | INTRAVENOUS | Status: AC
  Administered 2017-01-15: 1000 mL via INTRAVENOUS

## 2017-01-15 MED ORDER — SODIUM CHLORIDE 0.9 % IV BOLUS (SEPSIS)
2000.0000 mL | Freq: Once | INTRAVENOUS | Status: AC
  Administered 2017-01-15: 2000 mL via INTRAVENOUS

## 2017-01-15 MED ORDER — KETOROLAC TROMETHAMINE 30 MG/ML IJ SOLN
30.0000 mg | Freq: Once | INTRAMUSCULAR | Status: AC
  Administered 2017-01-15: 30 mg via INTRAVENOUS
  Filled 2017-01-15: qty 1

## 2017-01-15 NOTE — Discharge Instructions (Signed)
TAKE IBUPROFEN FOR ACHES AND FOR ANY FEVER. PUSH FLUIDS AND GET PLENTY OF REST. RETURN TO THE EMERGENCY DEPARTMENT IF YOU BECOME SICKER OR HAVE NEW CONCERNS.

## 2017-01-15 NOTE — ED Provider Notes (Signed)
[facility]-EMERGENCY DEPT Provider Note   CSN: [id number] Arrival date & time: 01/14/17  2133     History   Chief Complaint Chief Complaint  Patient presents with  . Headache  . Mouth Lesions    bump on tongue  . Back Pain  . Sore Throat    HPI Belinda Long is a 21 y.o. female.  Patient with history of Factor V deficiency, DVT (no longer anticoagulated), presents with 3 days of progressively worsening sore throat, low back pain, generalized headache. She reports today she has been unable to eat or drink well because of the sore throat. She noticed a sore bump on the end of her tongue at the beginning of symptoms and states now there are several.  No nausea, vomiting, diarrhea, urinary symptoms, congestions or cough. She was unaware she had any fever until arrival here.    The history is provided by the patient. No language interpreter was used.  Headache   Associated symptoms include a fever (See HPI.). Pertinent negatives include no shortness of breath, no nausea and no vomiting.  Mouth Lesions  Associated symptoms: fever (See HPI.) and sore throat   Associated symptoms: no congestion and no rhinorrhea   Back Pain   Associated symptoms include a fever (See HPI.) and headaches. Pertinent negatives include no chest pain, no abdominal pain and no dysuria.  Sore Throat  Associated symptoms include headaches. Pertinent negatives include no chest pain, no abdominal pain and no shortness of breath.    Past Medical History:  Diagnosis Date  . 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 DVT, lower extremity, proximal, acute (HCC) 06/07/2012   left - was on OCP- Yaz  . Dysmenorrhea in the adolescent   . Factor V deficiency [facility])     Patient Active Problem List   Diagnosis Date Noted  . Perleche 03/21/2013  . 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 Adjustment disorder with depressed mood 09/19/2012  . Body image  problem 09/19/2012  . Anemia 08/14/2012  . DVT (deep venous thrombosis) (HCC) 06/22/2012  . Chronic anticoagulation 06/22/2012  . Encounter for monitoring coumadin therapy 06/12/2012  . Heterozygous factor V Leiden mutation (HCC) 06/12/2012  . Wears glasses 07/06/2011  . Left wrist injury 07/06/2011  . Well adolescent visit 07/06/2011  . PRIMARY DYSMENORRHEA 08/10/2010  . KNEE PAIN, BILATERAL 08/10/2010  . ALLERGIC RHINITIS 03/19/2010  . 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 TONSILLECTOMY      OB History    Gravida Para Term Preterm AB Living   0             SAB TAB Ectopic Multiple Live Births                   Home Medications    Prior to Admission medications   Medication Sig Start Date End Date Taking? Authorizing Provider  buPROPion (WELLBUTRIN XL) 300 MG 24 hr tablet  07/25/14   Historical Provider, MD  VYVANSE 50 MG capsule  11/11/15   Historical Provider, MD    Family History Family History  Problem Relation Age of Onset  . Cancer Maternal Grandmother     myeloma  . Hyperlipidemia Mother   . Depression Mother   . Factor V Leiden deficiency Father   . Prostate cancer Paternal Grandfather     Social History Social History  Substance  Use Topics  . Smoking status: Never Smoker  . Smokeless tobacco: Never Used  . Alcohol use No     Allergies   Patient has no known allergies.   Review of Systems Review of Systems  Constitutional: Positive for fever (See HPI.).  HENT: Positive for mouth sores, sore throat and trouble swallowing. Negative for congestion, rhinorrhea, sinus pain and sinus pressure.   Eyes: Negative for discharge.  Respiratory: Negative for cough and shortness of breath.   Cardiovascular: Negative for chest pain.  Gastrointestinal: Negative for abdominal pain, nausea and vomiting.  Genitourinary: Negative.  Negative for dysuria.  Musculoskeletal: Positive for back pain.  Neurological: Positive for  headaches.     Physical Exam Updated Vital Signs BP 124/64 (BP Location: Left Arm)   Pulse 87   Temp 100.1 F (37.8 C) (Oral)   Resp 18   LMP 12/21/2016   SpO2 100%   Physical Exam  Constitutional: She is oriented to person, place, and time. She appears well-developed and well-nourished.  HENT:  Head: Normocephalic.  Nose: Nose normal.  Mouth/Throat: Oropharyngeal exudate present.  Oropharynx erythematous with exudates. Uvula is midline. There is a small raised area to tip of tongue that appears while without pustule or vesicle.   Eyes: Conjunctivae are normal.  Neck: Normal range of motion. Neck supple.  Cardiovascular: Normal rate and regular rhythm.   No murmur heard. Pulmonary/Chest: Effort normal and breath sounds normal. She has no wheezes.  Abdominal: Soft. Bowel sounds are normal. There is no tenderness. There is no rebound and no guarding.  Musculoskeletal: Normal range of motion.  Neurological: She is alert and oriented to person, place, and time.  Skin: Skin is warm and dry. No rash noted.  Psychiatric: She has a normal mood and affect.  Nursing note and vitals reviewed.    ED Treatments / Results  Labs (all labs ordered are listed, but only abnormal results are displayed) Labs Reviewed  RAPID STREP SCREEN (NOT AT [facility])  CULTURE, GROUP A STREP ([facility])  CBC WITH DIFFERENTIAL/PLATELET  BASIC METABOLIC PANEL  URINALYSIS, ROUTINE W REFLEX MICROSCOPIC  PREGNANCY, URINE   Results for orders placed or performed during the hospital encounter of 01/15/17  Rapid strep screen  Result Value Ref Range   Streptococcus, Group A Screen (Direct) NEGATIVE NEGATIVE  Culture, group A strep  Result Value Ref Range   Specimen Description THROAT    Special Requests NONE Reflexed from [id number]    Culture TOO YOUNG TO READ    Report Status PENDING   CBC with Differential  Result Value Ref Range   WBC 9.7 4.0 - 10.5 K/uL   RBC 4.37 3.87 - 5.11 MIL/uL   Hemoglobin 13.6 12.0 -  15.0 g/dL   HCT 11.941.4 14.736.0 - 82.946.0 %   MCV 94.7 78.0 - 100.0 fL   MCH 31.1 26.0 - 34.0 pg   MCHC 32.9 30.0 - 36.0 g/dL   RDW 56.212.8 13.011.5 - 86.515.5 %   Platelets 206 150 - 400 K/uL   Neutrophils Relative % 72 %   Neutro Abs 6.9 1.7 - 7.7 K/uL   Lymphocytes Relative 17 %   Lymphs Abs 1.7 0.7 - 4.0 K/uL   Monocytes Relative 11 %   Monocytes Absolute 1.1 (H) 0.1 - 1.0 K/uL   Eosinophils Relative 0 %   Eosinophils Absolute 0.0 0.0 - 0.7 K/uL   Basophils Relative 0 %   Basophils Absolute 0.0 0.0 - 0.1 K/uL  Basic metabolic panel  Result Value  Ref Range   Sodium 136 135 - 145 mmol/L   Potassium 4.1 3.5 - 5.1 mmol/L   Chloride 101 101 - 111 mmol/L   CO2 25 22 - 32 mmol/L   Glucose, Bld 99 65 - 99 mg/dL   BUN 15 6 - 20 mg/dL   Creatinine, Ser 4.09 0.44 - 1.00 mg/dL   Calcium 8.9 8.9 - 81.1 mg/dL   GFR calc non Af Amer >60 >60 mL/min   GFR calc Af Amer >60 >60 mL/min   Anion gap 10 5 - 15  Urinalysis, Routine w reflex microscopic  Result Value Ref Range   Color, Urine YELLOW YELLOW   APPearance CLOUDY (A) CLEAR   Specific Gravity, Urine 1.019 1.005 - 1.030   pH 6.0 5.0 - 8.0   Glucose, UA NEGATIVE NEGATIVE mg/dL   Hgb urine dipstick NEGATIVE NEGATIVE   Bilirubin Urine NEGATIVE NEGATIVE   Ketones, ur 5 (A) NEGATIVE mg/dL   Protein, ur NEGATIVE NEGATIVE mg/dL   Nitrite NEGATIVE NEGATIVE   Leukocytes, UA NEGATIVE NEGATIVE  Pregnancy, urine  Result Value Ref Range   Preg Test, Ur NEGATIVE NEGATIVE  Mononucleosis screen  Result Value Ref Range   Mono Screen NEGATIVE NEGATIVE    EKG  EKG Interpretation None       Radiology No results found.  Procedures Procedures (including critical care time)  Medications Ordered in ED Medications  sodium chloride 0.9 % bolus 2,000 mL (not administered)  ketorolac (TORADOL) 30 MG/ML injection 30 mg (not administered)     Initial Impression / Assessment and Plan / ED Course  I have reviewed the triage vital signs and the nursing  notes.  Pertinent labs & imaging results that were available during my care of the patient were reviewed by me and considered in my medical decision making (see chart for details).     Patient presents with fever, sore throat, headache and mouth sores. She appears well and nontoxic. No GI symptoms.   She is given fluids here with improvement. Labs are essentially unremarkable. Likely viral process requiring supportive care. She is comfortable with discharge home.   Final Clinical Impressions(s) / ED Diagnoses   Final diagnoses:  None   1. Pharyngitis 2. Viral syndrome  New Prescriptions New Prescriptions   No medications on file     Elpidio Anis, PA-C 01/16/17 [id number]    Gilda Crease, MD 01/22/17 [phone]

## 2017-01-16 ENCOUNTER — Telehealth: Payer: Self-pay | Admitting: Family Medicine

## 2017-01-16 ENCOUNTER — Other Ambulatory Visit: Payer: Self-pay | Admitting: Family Medicine

## 2017-01-16 ENCOUNTER — Ambulatory Visit (INDEPENDENT_AMBULATORY_CARE_PROVIDER_SITE_OTHER): Payer: 59 | Admitting: Family Medicine

## 2017-01-16 VITALS — BP 100/78 | HR 64 | Temp 98.4°F | Wt 139.4 lb

## 2017-01-16 DIAGNOSIS — K1379 Other lesions of oral mucosa: Secondary | ICD-10-CM

## 2017-01-16 DIAGNOSIS — R6883 Chills (without fever): Secondary | ICD-10-CM | POA: Diagnosis not present

## 2017-01-16 DIAGNOSIS — M545 Low back pain, unspecified: Secondary | ICD-10-CM

## 2017-01-16 LAB — POCT INFLUENZA A/B
INFLUENZA B, POC: NEGATIVE
Influenza A, POC: NEGATIVE

## 2017-01-16 MED ORDER — LIDOCAINE VISCOUS 2 % MT SOLN: 5.0000 mL | OROMUCOSAL | 0 refills | Status: DC | PRN

## 2017-01-16 MED ORDER — LIDOCAINE VISCOUS 2 % MT SOLN
5.0000 mL | OROMUCOSAL | 0 refills | Status: DC | PRN
Start: 2017-01-16 — End: 2017-01-16

## 2017-01-16 NOTE — Progress Notes (Signed)
Pre visit review using our clinic review tool, if applicable. No additional management support is needed unless otherwise documented below in the visit note. 

## 2017-01-16 NOTE — Patient Instructions (Addendum)
Your symptoms are most likely related to a viral illness. Please use lidocaine rinse for your mouth as needed. Please drink plenty of water so that your urine is pale yellow or clear. Also, get plenty of rest, use tylenol or ibuprofen as needed for discomfort and follow up if symptoms do not improve in 3 to 4 days, worsen, or you develop a fever >101.

## 2017-01-16 NOTE — Progress Notes (Signed)
Subjective:    Patient ID: Belinda [name], female    DOB: 09/02/1996, 21 y.o.   MRN: [id number]  HPI  Belinda Long is a 21 year old female with a history of Factor V deficiency, DVT (no longer on anticoagulant) who presents today with sore in her mouth on the end of her tongue, sore throat, and fatigue that started 01/14/17.  Associated symptoms of generalized headache, chills, and low back pain that has improved since 01/14/17.  She is seeking care due to difficulty with eating or drinking due to pain associated with sore on the end of her tongue. She denies sinus pressure/pain, ear pain, cough, N/V/D, and myalgias other than low back pain that is improving. She has been seen and followed by hematology who diagnosed her with Factor V deficiency. Denies chest pain, pleuritic pain, or dyspnea today. She was seen in the ED 01/14/17 for symptoms of sore throat, low back pain, mouth sores, and generalized headache. She received fluids in the ED and received tramadol for back pain which provided moderate benefit. She was tested for strep, UTI, and mono which were all negative. CBC and metabolic panel were unremarkable. Other treatment at home with alka seltzer cold and flu and advil for the pain has provided moderate benefit.   Symptoms of mouth pain is aggravated with eating. Alleviating factors include use of advil and tramadol.  Review of Systems  Constitutional: Positive for chills and fatigue.  HENT: Positive for mouth sores and sore throat. Negative for rhinorrhea, sinus pain, sinus pressure and sneezing.   Respiratory: Negative for cough, shortness of breath and wheezing.   Cardiovascular: Negative for chest pain and palpitations.  Gastrointestinal: Negative for abdominal pain, diarrhea, nausea and vomiting.  Genitourinary: Negative for dysuria, flank pain, hematuria, pelvic pain and urgency.  Musculoskeletal: Positive for back pain. Negative for myalgias.  Neurological: Negative for dizziness,  light-headedness and headaches.   Past Medical History:  Diagnosis Date  . 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 DVT, lower extremity, proximal, acute (HCC) 06/07/2012   left - was on OCP- Yaz  . Dysmenorrhea in the adolescent   . Factor V deficiency (HCC)      Social History   Social History  . Marital status: Single    Spouse name: N/A  . Number of children: 0  . Years of education: N/A   Occupational History  . Not on file.   Social History Main Topics  . Smoking status: Never Smoker  . Smokeless tobacco: Never Used  . Alcohol use No  . Drug use: No  . Sexual activity: Not Currently    Partners: Male    Birth control/ protection: Abstinence   Other Topics Concern  . Not on file   Social History Narrative   Plays basketball and volleyball   [employer] going into 12th grade grades are good   HH of 3   3 dogs    Past Surgical History:  Procedure Laterality Date  . TONSILLECTOMY      Family History  Problem Relation Age of Onset  . Cancer Maternal Grandmother     myeloma  . Hyperlipidemia Mother   . Depression Mother   . Factor V Leiden deficiency Father   . Prostate cancer Paternal Grandfather     No Known Allergies  Current Outpatient Prescriptions on File Prior to Visit  Medication Sig Dispense Refill  . buPROPion (  WELLBUTRIN XL) 300 MG 24 hr tablet     . ibuprofen (ADVIL,MOTRIN) 600 MG tablet Take 1 tablet (600 mg total) by mouth every 6 (six) hours as needed. 30 tablet 0  . VYVANSE 50 MG capsule      No current facility-administered medications on file prior to visit.     BP 100/78 (BP Location: Left Arm, Patient Position: Sitting, Cuff Size: Normal)   Pulse 64   Temp 98.4 F (36.9 C) (Oral)   Wt 139 lb 6.4 oz (63.2 kg)   LMP 12/21/2016   SpO2 98%   BMI 21.83 kg/m       Objective:   Physical Exam  Constitutional: She is oriented to person, place, and time. She appears  well-developed and well-nourished.  HENT:  Right Ear: Tympanic membrane normal.  Left Ear: Tympanic membrane normal.  Nose: No rhinorrhea. Right sinus exhibits no maxillary sinus tenderness and no frontal sinus tenderness. Left sinus exhibits no maxillary sinus tenderness and no frontal sinus tenderness.  Mouth/Throat: Uvula is midline and mucous membranes are normal. Oral lesions present. No oropharyngeal exudate or posterior oropharyngeal erythema.  Two small raised areas that appear to white with minor erytherma but are not associated with a papule or vesicle.   Eyes: Pupils are equal, round, and reactive to light. No scleral icterus.  Neck: Neck supple.  Cardiovascular: Normal rate and regular rhythm.   Pulmonary/Chest: Effort normal and breath sounds normal. She has no wheezes. She has no rales.  Abdominal: Soft. Bowel sounds are normal. There is no tenderness.  Musculoskeletal:  Spine with normal alignment and no deformity. No tenderness to vertebral process with palpation with the exception of mild tenderness around paraspinous muscles in the area of L5. ROM is full at lumbar sacral regions. Negative Straight Leg raise. No CVA tenderness present. Able to heel/toe walk without pain.  Lymphadenopathy:    She has cervical adenopathy.  Neurological: She is alert and oriented to person, place, and time. Coordination normal.  Skin: Skin is warm and dry. No rash noted.      Assessment & Plan:  1. Sore in mouth Prior workup with labs are unremarkable from the ED. Symptoms are improving per patient with the exception of pain in mouth from sores on the end of her tongue which is interfering with eating and drinking. Will treat with lidocaine for discomfort. Exam is reassuring; suspect that symptom may likely be viral in origin. Advised supportive care. - lidocaine (XYLOCAINE) 2 % solution; Use as directed 5 mLs in the mouth or throat as needed for mouth pain.  Dispense: 100 mL; Refill: 0  2.  Chills POC influenza negative; Exam reassuring; symptoms are likely viral in origin;  - POCT Influenza A/B   3. Low back pain without sciatica, unspecified back pain laterality, unspecified chronicity Exam and history are reassuring; pain is improving; advise continued use of tramadol and advil that was recommended in the ED. Advised follow up if symptoms do not continue to improve, worsen, or she develops new symptoms.   Advised patient on supportive measures:  Get rest, drink plenty of fluids, and use tylenol or ibuprofen as needed for pain. Follow up if fever >101, if symptoms worsen or if symptoms are not improved in 3 to 4 days. Patient verbalizes understanding.   Roddie Mc, FNP-C

## 2017-01-16 NOTE — Telephone Encounter (Signed)
Pharmacy called for pt to request a refill of lidocaine (XYLOCAINE) 2 % solution ibuprofen (ADVIL,MOTRIN) 600 MG tablet (they never got this either)  They did not get and pt is there. [facility]

## 2017-01-17 LAB — CULTURE, GROUP A STREP (THRC)

## 2017-01-17 NOTE — Telephone Encounter (Signed)
Amil AmenJulia verbally called in med

## 2017-01-18 ENCOUNTER — Encounter: Payer: Self-pay | Admitting: Family Medicine

## 2017-01-18 ENCOUNTER — Other Ambulatory Visit: Payer: Self-pay | Admitting: Family Medicine

## 2017-01-18 ENCOUNTER — Other Ambulatory Visit: Payer: Self-pay

## 2017-01-18 DIAGNOSIS — K1379 Other lesions of oral mucosa: Secondary | ICD-10-CM

## 2017-01-18 MED ORDER — LIDOCAINE VISCOUS 2 % MT SOLN: 5.0000 mL | OROMUCOSAL | 0 refills | Status: DC | PRN

## 2017-01-18 NOTE — Progress Notes (Signed)
Per pt she "dropped" the bottle and it spilled. Rx refilled one time only.

## 2017-01-19 ENCOUNTER — Other Ambulatory Visit: Payer: Self-pay

## 2017-01-19 NOTE — Telephone Encounter (Signed)
Please advise as to pt's questions regarding medications and symptoms.  Thanks!

## 2017-01-21 ENCOUNTER — Other Ambulatory Visit: Payer: Self-pay | Admitting: Family Medicine

## 2017-01-21 DIAGNOSIS — K1379 Other lesions of oral mucosa: Secondary | ICD-10-CM

## 2017-01-21 MED ORDER — LIDOCAINE VISCOUS 2 % MT SOLN: 5.0000 mL | OROMUCOSAL | 0 refills | Status: DC | PRN

## 2017-03-07 ENCOUNTER — Encounter: Payer: Self-pay | Admitting: Family Medicine

## 2017-03-07 ENCOUNTER — Ambulatory Visit (INDEPENDENT_AMBULATORY_CARE_PROVIDER_SITE_OTHER): Payer: 59 | Admitting: Family Medicine

## 2017-03-07 VITALS — BP 100/68 | HR 64 | Temp 99.0°F | Ht 66.25 in | Wt 133.0 lb

## 2017-03-07 DIAGNOSIS — Z23 Encounter for immunization: Secondary | ICD-10-CM

## 2017-03-07 DIAGNOSIS — Z0000 Encounter for general adult medical examination without abnormal findings: Secondary | ICD-10-CM | POA: Diagnosis not present

## 2017-03-07 LAB — TSH: TSH: 2.99 u[IU]/mL (ref 0.35–4.50)

## 2017-03-07 LAB — LIPID PANEL
CHOL/HDL RATIO: 2
Cholesterol: 161 mg/dL (ref 0–200)
HDL: 72.7 mg/dL (ref >39.00)
LDL Cholesterol: 78 mg/dL (ref 0–99)
NONHDL: 88.48
Triglycerides: 50 mg/dL (ref 0.0–149.0)
VLDL: 10 mg/dL (ref 0.0–40.0)

## 2017-03-07 LAB — HEPATIC FUNCTION PANEL
ALK PHOS: 53 U/L (ref 39–117)
ALT: 17 U/L (ref 0–35)
AST: 23 U/L (ref 0–37)
Albumin: 5.2 g/dL (ref 3.5–5.2)
BILIRUBIN DIRECT: 0.1 mg/dL (ref 0.0–0.3)
BILIRUBIN TOTAL: 0.7 mg/dL (ref 0.2–1.2)
Total Protein: 7.3 g/dL (ref 6.0–8.3)

## 2017-03-07 NOTE — Progress Notes (Signed)
Subjective:     Patient ID: Belinda Long, female   DOB: 12/20/1995, 21 y.o.   MRN: [id number]  HPI Belinda Long is here for physical exam. Generally very healthy. Never smoked. She had DVT which was a solitary occurrence back in 2013. Workup at that time significant for factor V Leiden . She was on oral contraception at that time and was treated for about 4 months with Coumadin. She was taken off at that time and has had no signs of recurrence. She had some concerns because she is getting ready to start grad school and apparently her grand school will require some travel by flight[place] in [place].  She has history of ADD and takes Vyvanse. She has acne and takes Aldactone and ampicillin for that. She needs tetanus booster. Other immunizations are up-to-date.  Currently not sexually active. Has never had baseline Pap smear. No history of STD.  Had ER visit back in February with screening test normal. CBC and basic chemistries were normal then. She has never had lipid panel  Past Medical History:  Diagnosis Date  . 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 DVT, lower extremity, proximal, acute (HCC) 06/07/2012   left - was on OCP- Yaz  . Dysmenorrhea in the adolescent   . Factor V deficiency (HCC)    Past Surgical History:  Procedure Laterality Date  . TONSILLECTOMY      reports that she has never smoked. She has never used smokeless tobacco. She reports that she does not drink alcohol or use drugs. family history includes Cancer in her maternal grandmother; Depression in her mother; Factor V Leiden deficiency in her father; Hyperlipidemia in her mother; Prostate cancer in her paternal grandfather. No Known Allergies   Review of Systems  Constitutional: Negative for activity change, appetite change, fatigue, fever and unexpected weight change.  HENT: Negative for ear pain, hearing loss, sore throat and trouble swallowing.   Eyes:  Negative for visual disturbance.  Respiratory: Negative for cough and shortness of breath.   Cardiovascular: Negative for chest pain and palpitations.  Gastrointestinal: Negative for abdominal pain, blood in stool, constipation and diarrhea.  Endocrine: Negative for polydipsia and polyuria.  Genitourinary: Negative for dysuria and hematuria.  Musculoskeletal: Negative for arthralgias, back pain and myalgias.  Skin: Negative for rash.  Neurological: Negative for dizziness, syncope and headaches.  Hematological: Negative for adenopathy.  Psychiatric/Behavioral: Negative for confusion and dysphoric mood.       Objective:   Physical Exam  Constitutional: She is oriented to person, place, and time. She appears well-developed and well-nourished.  HENT:  Head: Normocephalic and atraumatic.  Eyes: EOM are normal. Pupils are equal, round, and reactive to light.  Neck: Normal range of motion. Neck supple. No thyromegaly present.  Cardiovascular: Normal rate, regular rhythm and normal heart sounds.   No murmur heard. Pulmonary/Chest: Breath sounds normal. No respiratory distress. She has no wheezes. She has no rales.  Abdominal: Soft. Bowel sounds are normal. She exhibits no distension and no mass. There is no tenderness. There is no rebound and no guarding.  Musculoskeletal: Normal range of motion. She exhibits no edema.  Lymphadenopathy:    She has no cervical adenopathy.  Neurological: She is alert and oriented to person, place, and time. She displays normal reflexes. No cranial nerve deficit.  Skin: No rash noted.  Psychiatric: She has a normal mood and affect. Her behavior is normal.  Judgment and thought content normal.       Assessment:     Physical exam.  Generally very healthy. She exercises most days of the week. She needs tetanus booster. Remote history of DVT about 5 years ago. Her risk factors that time were factor V Leiden deficiency and oral contraception.    Plan:      -Check lipid, hepatic, and TSH. We did not duplicate other labs ordered in ER recently -Tetanus booster given -Continue regular exercise habits. -Weighted very long discussion regarding pros and cons of chronic anticoagulation. She does have risk factor of factor V Leiden as above but in some ways this was provoked in the sense that she was on oral contraception at the time. We talked about measures to reduce clotting risk with things like flying such as adequate hydration, getting up and moving around frequently, compression stockings -We recommended that she get baseline Pap smear given her age. She plans to set up with GYN  Belinda Covey MD [facility]

## 2017-03-07 NOTE — Progress Notes (Signed)
Pre visit review using our clinic review tool, if applicable. No additional management support is needed unless otherwise documented below in the visit note. 

## 2018-08-14 ENCOUNTER — Emergency Department (HOSPITAL_COMMUNITY): Payer: 59

## 2018-08-14 ENCOUNTER — Emergency Department (HOSPITAL_COMMUNITY)
Admission: EM | Admit: 2018-08-14 | Discharge: 2018-08-14 | Disposition: A | Discharge status: Home / Self Care | Payer: 59 | Attending: Emergency Medicine | Admitting: Emergency Medicine

## 2018-08-14 ENCOUNTER — Ambulatory Visit (INDEPENDENT_AMBULATORY_CARE_PROVIDER_SITE_OTHER): Payer: 59 | Admitting: Adult Health

## 2018-08-14 ENCOUNTER — Encounter (HOSPITAL_COMMUNITY): Payer: Self-pay | Admitting: Emergency Medicine

## 2018-08-14 ENCOUNTER — Encounter: Payer: Self-pay | Admitting: Adult Health

## 2018-08-14 VITALS — BP 100/70 | Temp 99.1°F | Wt 139.0 lb

## 2018-08-14 DIAGNOSIS — Z79899 Other long term (current) drug therapy: Secondary | ICD-10-CM | POA: Insufficient documentation

## 2018-08-14 DIAGNOSIS — N83201 Unspecified ovarian cyst, right side: Secondary | ICD-10-CM | POA: Diagnosis not present

## 2018-08-14 DIAGNOSIS — R1031 Right lower quadrant pain: Secondary | ICD-10-CM | POA: Diagnosis not present

## 2018-08-14 LAB — URINALYSIS, ROUTINE W REFLEX MICROSCOPIC
Bilirubin Urine: NEGATIVE
GLUCOSE, UA: NEGATIVE mg/dL
Ketones, ur: NEGATIVE mg/dL
Leukocytes, UA: NEGATIVE
NITRITE: NEGATIVE
Protein, ur: NEGATIVE mg/dL
SPECIFIC GRAVITY, URINE: 1.014 (ref 1.005–1.030)
pH: 7 (ref 5.0–8.0)

## 2018-08-14 LAB — POC URINALSYSI DIPSTICK (AUTOMATED)
BILIRUBIN UA: NEGATIVE
Blood, UA: NEGATIVE
Glucose, UA: NEGATIVE
KETONES UA: NEGATIVE
LEUKOCYTES UA: NEGATIVE
Nitrite, UA: NEGATIVE
Protein, UA: NEGATIVE
SPEC GRAV UA: 1.015 (ref 1.010–1.025)
Urobilinogen, UA: 0.2 E.U./dL
pH, UA: 8 (ref 5.0–8.0)

## 2018-08-14 LAB — COMPREHENSIVE METABOLIC PANEL
ALBUMIN: 4.2 g/dL (ref 3.5–5.0)
ALK PHOS: 49 U/L (ref 38–126)
ALT: 54 U/L — AB (ref 0–44)
AST: 54 U/L — AB (ref 15–41)
Anion gap: 10 (ref 5–15)
BILIRUBIN TOTAL: 1 mg/dL (ref 0.3–1.2)
BUN: 14 mg/dL (ref 6–20)
CALCIUM: 9.3 mg/dL (ref 8.9–10.3)
CO2: 25 mmol/L (ref 22–32)
CREATININE: 0.82 mg/dL (ref 0.44–1.00)
Chloride: 107 mmol/L (ref 98–111)
GFR calc Af Amer: >60 mL/min (ref >60)
GFR calc non Af Amer: >60 mL/min (ref >60)
GLUCOSE: 95 mg/dL (ref 70–99)
Potassium: 3.8 mmol/L (ref 3.5–5.1)
Sodium: 142 mmol/L (ref 135–145)
TOTAL PROTEIN: 7.1 g/dL (ref 6.5–8.1)

## 2018-08-14 LAB — CBC
HCT: 43 % (ref 36.0–46.0)
Hemoglobin: 15.2 g/dL — ABNORMAL HIGH (ref 12.0–15.0)
MCH: 32.7 pg (ref 26.0–34.0)
MCHC: 35.3 g/dL (ref 30.0–36.0)
MCV: 92.5 fL (ref 78.0–100.0)
Platelets: 209 10*3/uL (ref 150–400)
RBC: 4.65 MIL/uL (ref 3.87–5.11)
RDW: 12.4 % (ref 11.5–15.5)
WBC: 12.2 10*3/uL — ABNORMAL HIGH (ref 4.0–10.5)

## 2018-08-14 LAB — I-STAT BETA HCG BLOOD, ED (MC, WL, AP ONLY): I-stat hCG, quantitative: <5 m[IU]/mL (ref <5)

## 2018-08-14 LAB — POCT URINE PREGNANCY: Preg Test, Ur: NEGATIVE

## 2018-08-14 LAB — LIPASE, BLOOD: Lipase: 29 U/L (ref 11–51)

## 2018-08-14 MED ORDER — HYDROCODONE-ACETAMINOPHEN 5-325 MG PO TABS: 1.0000 | ORAL_TABLET | ORAL | 0 refills | Status: DC | PRN

## 2018-08-14 MED ORDER — SODIUM CHLORIDE 0.9 % IV BOLUS
1000.0000 mL | Freq: Once | INTRAVENOUS | Status: AC
  Administered 2018-08-14: 1000 mL via INTRAVENOUS

## 2018-08-14 MED ORDER — ONDANSETRON HCL 4 MG/2ML IJ SOLN
4.0000 mg | Freq: Once | INTRAMUSCULAR | Status: AC
  Administered 2018-08-14: 4 mg via INTRAVENOUS
  Filled 2018-08-14: qty 2

## 2018-08-14 MED ORDER — KETOROLAC TROMETHAMINE 30 MG/ML IJ SOLN
30.0000 mg | Freq: Once | INTRAMUSCULAR | Status: AC
  Administered 2018-08-14: 30 mg via INTRAVENOUS
  Filled 2018-08-14: qty 1

## 2018-08-14 MED ORDER — FENTANYL CITRATE (PF) 100 MCG/2ML IJ SOLN
50.0000 ug | Freq: Once | INTRAMUSCULAR | Status: AC
  Administered 2018-08-14: 50 ug via INTRAVENOUS
  Filled 2018-08-14: qty 2

## 2018-08-14 NOTE — Progress Notes (Addendum)
Subjective:    Patient ID: Belinda Long, female    DOB: 1996-03-13, 23 y.o.   MRN: [id number]  HPI  21 year old female who  has a past medical history of ACNE (10/17/2007), DVT, lower extremity, proximal, acute (HCC) (06/07/2012), Dysmenorrhea in the adolescent, and Factor V deficiency (HCC).  She presents to the office today for an acute issue of concern for UTI. Her symptoms include that of low back pain, pelvic pain, fever, and feeling ill. She denies any burning with urination, frequency or urgency. She has not experiencing any vomiting or diarrhea but has nausea Symptoms have been progressively getting worse over the last 4 days.   No vaginal pain, discharge or concern for STD.   She had a bowel movement yesterday and the day before but not today.   Review of Systems See HPI   Past Medical History:  Diagnosis Date  . 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 DVT, lower extremity, proximal, acute (HCC) 06/07/2012   left - was on OCP- Yaz  . Dysmenorrhea in the adolescent   . Factor V deficiency (HCC)     Social History   Socioeconomic History  . Marital status: Single    Spouse name: Not on file  . Number of children: 0  . Years of education: Not on file  . Highest education level: Not on file  Occupational History  . Not on file  Social Needs  . Financial resource strain: Not on file  . Food insecurity:    Worry: Not on file    Inability: Not on file  . Transportation needs:    Medical: Not on file    Non-medical: Not on file  Tobacco Use  . Smoking status: Never Smoker  . Smokeless tobacco: Never Used  Substance and Sexual Activity  . Alcohol use: No    Alcohol/week: 0.0 standard drinks  . Drug use: No  . Sexual activity: Not Currently    Partners: Male    Birth control/protection: Abstinence  Lifestyle  . Physical activity:    Days per week: Not on file    Minutes per session: Not on file  . Stress:  Not on file  Relationships  . Social connections:    Talks on phone: Not on file    Gets together: Not on file    Attends religious service: Not on file    Active member of club or organization: Not on file    Attends meetings of clubs or organizations: Not on file    Relationship status: Not on file  . Intimate partner violence:    Fear of current or ex partner: Not on file    Emotionally abused: Not on file    Physically abused: Not on file    Forced sexual activity: Not on file  Other Topics Concern  . Not on file  Social History Narrative   Plays basketball and volleyball   [employer] going into 12th grade grades are good   HH of 3   3 dogs    Past Surgical History:  Procedure Laterality Date  . TONSILLECTOMY      Family History  Problem Relation Age of Onset  . Cancer Maternal Grandmother        myeloma  . Hyperlipidemia Mother   . Depression Mother   . Factor V Leiden deficiency Father   . Prostate cancer Paternal Grandfather  No Known Allergies  Current Outpatient Medications on File Prior to Visit  Medication Sig Dispense Refill  . ampicillin (PRINCIPEN) 500 MG capsule     . spironolactone (ALDACTONE) 25 MG tablet     . VYVANSE 50 MG capsule      No current facility-administered medications on file prior to visit.     BP 100/70   Temp 99.1 F (37.3 C) (Oral)   Wt 139 lb (63 kg)   BMI 22.27 kg/m      Objective:   Physical Exam  Constitutional: She appears well-developed and well-nourished. She appears ill. No distress.  Cardiovascular: Normal rate, regular rhythm, normal heart sounds and intact distal pulses.  Pulmonary/Chest: Effort normal and breath sounds normal.  Abdominal: Soft. Bowel sounds are normal. She exhibits no mass. There is no hepatosplenomegaly, splenomegaly or hepatomegaly. There is tenderness in the right lower quadrant, periumbilical area and left lower quadrant. There is guarding and tenderness at McBurney's point.  There is no rebound and negative Murphy's sign. No hernia.  Skin: Skin is warm and dry. She is not diaphoretic.  Psychiatric: She has a normal mood and affect. Her behavior is normal. Judgment and thought content normal.  Nursing note and vitals reviewed.     Assessment & Plan:  1. Right lower quadrant abdominal pain - Concern for appendicitis vs ovarian cyst vs ovarian torsion  - Very tender in right lower quadrant. Unable to get outpatient stat CT of abdomen today. Her mom will driver her to [facility] ER - POCT Urinalysis Dipstick (Automated)- negative  - POCT urine pregnancy- negative    Shirline Frees, NP

## 2018-08-14 NOTE — ED Provider Notes (Signed)
4:08 PM-checkout from Dr. Rosalia Hammers to evaluate Belinda Long after ultrasound to rule out torsed right ovarian cyst.  Belinda Long requests additional pain medicine at this time.  Clinical Course as of Aug 14 2057  Tue Aug 14, 2018  2052 Ordered  Urine culture [EW]  2052 Negative  I-Stat beta hCG blood, ED [EW]  2052 Normal except mild elevation of AST and ALT  Comprehensive metabolic panel(!) [EW]  2052 Normal except blood and bacteria present, urine culture ordered  Urinalysis, Routine w reflex microscopic(!) [EW]  2053 Normal  Lipase, blood [EW]  2053 Normal except white count elevated, hemoglobin elevated  CBC(!) [EW]  2054 I discussed the case with Dr. Catalina Antigua, on-call gynecologist.  She recommends symptomatic treatment with ibuprofen, heat to affected area and office follow-up in 2 weeks.  Her office will call the Belinda Long for a follow-up appointment.   [EW]    Clinical Course User Index [EW] Mancel Bale, MD     Belinda Long Vitals for the past 24 hrs:  BP Temp Temp src Pulse Resp SpO2 Height Weight  08/14/18 1819 112/66 - - 82 16 100 % - -  08/14/18 1800 112/66 - - 88 - 100 % - -  08/14/18 1730 (!) 110/59 - - 63 - 100 % - -  08/14/18 1714 118/63 - - 68 16 100 % - -  08/14/18 1700 118/63 - - 66 - 100 % - -  08/14/18 1630 113/62 - - 72 - 100 % - -  08/14/18 1600 (!) 112/55 - - 69 - 100 % - -  08/14/18 1557 (!) 110/59 - - 69 16 100 % - -  08/14/18 1530 (!) 110/59 - - 69 - 100 % - -  08/14/18 1510 116/68 - - 63 16 100 % - -  08/14/18 1422 120/79 98.5 F (36.9 C) Oral (!) 101 16 100 % 5\' 7"  (1.702 m) 63 kg    8:56 PM Reevaluation with update and discussion. After initial assessment and treatment, an updated evaluation reveals she is more comfortable now, she is concerned that she will need a stronger pain medicine at home for pain similar to that which she had earlier today.  Findings discussed with Belinda Long and mother, all questions answered. Mancel Bale   Medical Decision Making:  Right lower quadrant abdominal pain secondary to hemorrhagic cyst in the right ovary.  This is most likely hemorrhagic cyst but will need outpatient follow-up with gynecology to ensure that there are no unsuspected abnormalities.  Consultation has been made with gynecology who will evaluate the Belinda Long in the office.  CRITICAL CARE-no Performed by: Mancel Bale   Nursing Notes Reviewed/ Care Coordinated Applicable Imaging Reviewed Interpretation of Laboratory Data incorporated into ED treatment  The Belinda Long appears reasonably screened and/or stabilized for discharge and I doubt any other medical condition or other [facility] requiring further screening, evaluation, or treatment in the ED at this time prior to discharge.  Plan: Home Medications-ibuprofen for pain; Home Treatments-rest, heat, light duty at work; return here if the recommended treatment, does not improve the symptoms; Recommended follow up-gynecology within 2 weeks.  Follow-up earlier if needed for problems or concerns    Results for orders placed or performed during the hospital encounter of 08/14/18  Lipase, blood  Result Value Ref Range   Lipase 29 11 - 51 U/L  Comprehensive metabolic panel  Result Value Ref Range   Sodium 142 135 - 145 mmol/L   Potassium 3.8 3.5 - 5.1 mmol/L   Chloride  107 98 - 111 mmol/L   CO2 25 22 - 32 mmol/L   Glucose, Bld 95 70 - 99 mg/dL   BUN 14 6 - 20 mg/dL   Creatinine, Ser 8.29 0.44 - 1.00 mg/dL   Calcium 9.3 8.9 - 56.2 mg/dL   Total Protein 7.1 6.5 - 8.1 g/dL   Albumin 4.2 3.5 - 5.0 g/dL   AST 54 (H) 15 - 41 U/L   ALT 54 (H) 0 - 44 U/L   Alkaline Phosphatase 49 38 - 126 U/L   Total Bilirubin 1.0 0.3 - 1.2 mg/dL   GFR calc non Af Amer >60 >60 mL/min   GFR calc Af Amer >60 >60 mL/min   Anion gap 10 5 - 15  CBC  Result Value Ref Range   WBC 12.2 (H) 4.0 - 10.5 K/uL   RBC 4.65 3.87 - 5.11 MIL/uL   Hemoglobin 15.2 (H) 12.0 - 15.0 g/dL   HCT 13.0 86.5 - 78.4 %   MCV 92.5 78.0 - 100.0 fL    MCH 32.7 26.0 - 34.0 pg   MCHC 35.3 30.0 - 36.0 g/dL   RDW 69.6 29.5 - 28.4 %   Platelets 209 150 - 400 K/uL  Urinalysis, Routine w reflex microscopic  Result Value Ref Range   Color, Urine YELLOW YELLOW   APPearance CLEAR CLEAR   Specific Gravity, Urine 1.014 1.005 - 1.030   pH 7.0 5.0 - 8.0   Glucose, UA NEGATIVE NEGATIVE mg/dL   Hgb urine dipstick SMALL (A) NEGATIVE   Bilirubin Urine NEGATIVE NEGATIVE   Ketones, ur NEGATIVE NEGATIVE mg/dL   Protein, ur NEGATIVE NEGATIVE mg/dL   Nitrite NEGATIVE NEGATIVE   Leukocytes, UA NEGATIVE NEGATIVE   RBC / HPF 0-5 0 - 5 RBC/hpf   WBC, UA 0-5 0 - 5 WBC/hpf   Bacteria, UA MANY (A) NONE SEEN   Squamous Epithelial / LPF 0-5 0 - 5   Mucus PRESENT   I-Stat beta hCG blood, ED  Result Value Ref Range   I-stat hCG, quantitative <5.0 <5 mIU/mL   Comment 3          US Transvaginal Non-ob  Result Date: 08/14/2018 CLINICAL DATA:  Right pelvic pain for 3 days. EXAM: TRANSABDOMINAL AND TRANSVAGINAL ULTRASOUND OF PELVIS DOPPLER ULTRASOUND OF OVARIES TECHNIQUE: Both transabdominal and transvaginal ultrasound examinations of the pelvis were performed. Transabdominal technique was performed for global imaging of the pelvis including uterus, ovaries, adnexal regions, and pelvic cul-de-sac. It was necessary to proceed with endovaginal exam following the transabdominal exam to visualize the endometrium and ovaries. Color and duplex Doppler ultrasound was utilized to evaluate blood flow to the ovaries. COMPARISON:  None. FINDINGS: Uterus Measurements: 7.8 x 3 x 3.9 cm. No fibroids or other mass visualized. Endometrium Thickness: 8.6 mm.  No focal abnormality visualized. Right ovary Measurements: 7.8 x 5.2 x 6.6 cm. Contains a complex cystic mass measuring 6.8 x 4.8 x 5.9 cm. There is blood flow in the rim of ovarian tissue surrounding the mass. There is no definitive blood flow within the mass. Left ovary Measurements: 3.5 x 2.0 x 1.7 cm. Normal appearance/no adnexal  mass. Pulsed Doppler evaluation of both ovaries demonstrates normal low-resistance arterial and venous waveforms. Other findings Trace fluid in the cul-de-sac is likely physiologic. IMPRESSION: 1. There is a complex cystic mass in the right ovary measuring 6.8 x 4.8 x 5.9 cm. I suspect this is most likely a large hemorrhagic follicle. A neoplasm is not excluded on this single  study. There is no blood flow within the mass. Recommend a follow-up ultrasound in 6-12 weeks to ensure resolution. 2. There is blood flow in the rim of tissue surrounding the right-sided ovarian mass. No evidence of torsion on this study. 3. No other abnormalities. Electronically Signed   By: Gerome Sam III M.D   On: 08/14/2018 19:37   US Pelvis Complete  Result Date: 08/14/2018 CLINICAL DATA:  Right pelvic pain for 3 days. EXAM: TRANSABDOMINAL AND TRANSVAGINAL ULTRASOUND OF PELVIS DOPPLER ULTRASOUND OF OVARIES TECHNIQUE: Both transabdominal and transvaginal ultrasound examinations of the pelvis were performed. Transabdominal technique was performed for global imaging of the pelvis including uterus, ovaries, adnexal regions, and pelvic cul-de-sac. It was necessary to proceed with endovaginal exam following the transabdominal exam to visualize the endometrium and ovaries. Color and duplex Doppler ultrasound was utilized to evaluate blood flow to the ovaries. COMPARISON:  None. FINDINGS: Uterus Measurements: 7.8 x 3 x 3.9 cm. No fibroids or other mass visualized. Endometrium Thickness: 8.6 mm.  No focal abnormality visualized. Right ovary Measurements: 7.8 x 5.2 x 6.6 cm. Contains a complex cystic mass measuring 6.8 x 4.8 x 5.9 cm. There is blood flow in the rim of ovarian tissue surrounding the mass. There is no definitive blood flow within the mass. Left ovary Measurements: 3.5 x 2.0 x 1.7 cm. Normal appearance/no adnexal mass. Pulsed Doppler evaluation of both ovaries demonstrates normal low-resistance arterial and venous waveforms.  Other findings Trace fluid in the cul-de-sac is likely physiologic. IMPRESSION: 1. There is a complex cystic mass in the right ovary measuring 6.8 x 4.8 x 5.9 cm. I suspect this is most likely a large hemorrhagic follicle. A neoplasm is not excluded on this single study. There is no blood flow within the mass. Recommend a follow-up ultrasound in 6-12 weeks to ensure resolution. 2. There is blood flow in the rim of tissue surrounding the right-sided ovarian mass. No evidence of torsion on this study. 3. No other abnormalities. Electronically Signed   By: Gerome Sam III M.D   On: 08/14/2018 19:37   [place] Art/ven Flow Abd Pelv Doppler  Result Date: 08/14/2018 CLINICAL DATA:  Right pelvic pain for 3 days. EXAM: TRANSABDOMINAL AND TRANSVAGINAL ULTRASOUND OF PELVIS DOPPLER ULTRASOUND OF OVARIES TECHNIQUE: Both transabdominal and transvaginal ultrasound examinations of the pelvis were performed. Transabdominal technique was performed for global imaging of the pelvis including uterus, ovaries, adnexal regions, and pelvic cul-de-sac. It was necessary to proceed with endovaginal exam following the transabdominal exam to visualize the endometrium and ovaries. Color and duplex Doppler ultrasound was utilized to evaluate blood flow to the ovaries. COMPARISON:  None. FINDINGS: Uterus Measurements: 7.8 x 3 x 3.9 cm. No fibroids or other mass visualized. Endometrium Thickness: 8.6 mm.  No focal abnormality visualized. Right ovary Measurements: 7.8 x 5.2 x 6.6 cm. Contains a complex cystic mass measuring 6.8 x 4.8 x 5.9 cm. There is blood flow in the rim of ovarian tissue surrounding the mass. There is no definitive blood flow within the mass. Left ovary Measurements: 3.5 x 2.0 x 1.7 cm. Normal appearance/no adnexal mass. Pulsed Doppler evaluation of both ovaries demonstrates normal low-resistance arterial and venous waveforms. Other findings Trace fluid in the cul-de-sac is likely physiologic. IMPRESSION: 1. There is a complex  cystic mass in the right ovary measuring 6.8 x 4.8 x 5.9 cm. I suspect this is most likely a large hemorrhagic follicle. A neoplasm is not excluded on this single study. There is no blood flow within  the mass. Recommend a follow-up ultrasound in 6-12 weeks to ensure resolution. 2. There is blood flow in the rim of tissue surrounding the right-sided ovarian mass. No evidence of torsion on this study. 3. No other abnormalities. Electronically Signed   By: Gerome Sam III M.D   On: 08/14/2018 19:37   Ct Renal Stone Study  Addendum Date: 08/14/2018   ADDENDUM REPORT: 08/14/2018 16:04 ADDENDUM: Laterality areas within the body of the report. Reproductive section should be created to state: Reproductive: Unremarkable uterus and LEFT adnexa. Complicated cystic RIGHT ovarian mass with septation and question slightly irregular wall overall measuring 6.2 x 5.6 x 5.4 cm. The adnexal mass is located within the RIGHT ovary, not the LEFT. Impression of the report correctly localizes the finding to the RIGHT adnexa. Discussed with Dr. Rosalia Hammers. Electronically Signed   By: Ulyses Southward M.D.   On: 08/14/2018 16:04   Result Date: 08/14/2018 CLINICAL DATA:  RIGHT-side abdominal and flank pain with nausea were since 1000 hours today question kidney stone EXAM: CT ABDOMEN AND PELVIS WITHOUT CONTRAST TECHNIQUE: Multidetector CT imaging of the abdomen and pelvis was performed following the standard protocol without IV contrast. Sagittal and coronal MPR images reconstructed from axial data set. Oral contrast not administered for this indication. COMPARISON:  06/07/2012 FINDINGS: Lower chest: Lung bases clear Hepatobiliary: Gallbladder and liver normal appearance Pancreas: Normal appearance Spleen: Normal appearance Adrenals/Urinary Tract: Adrenal glands normal appearance. Kidneys, ureters and bladder normal appearance Stomach/Bowel: Normal appendix. Decompressed stomach. Large and small bowel loops unremarkable. Vascular/Lymphatic: Aorta  normal caliber.  No adenopathy. Reproductive: Unremarkable uterus and LEFT adnexa. Complicated cystic LEFT ovarian mass with a septation and question slightly irregular wall overall measuring 6.2 x 5.6 x 5.4 cm. Other: No free air or free fluid.  No hernia. Musculoskeletal: Unremarkable IMPRESSION: Normal appendix. No urinary tract calcification or dilatation. Complicated RIGHT adnexal mass 6.2 x 5.6 x 5.4 cm; recommend pelvic sonography with Doppler assessment to characterize lesion and exclude ovarian torsion. Findings called to Dr. Rosalia Hammers on 08/14/2018 at 1551 hrs. Electronically Signed: By: Ulyses Southward M.D. On: 08/14/2018 15:52     Mancel Bale, MD 08/14/18 [phone]

## 2018-08-14 NOTE — ED Triage Notes (Signed)
Pt referred here from [facility] for lower abd pains with sharp pains on RLQ and tenderness. Pains started on Saturday and gotten worse. denies n/v/d, reports urine on Saturday was dark and odorous.

## 2018-08-14 NOTE — ED Provider Notes (Addendum)
[facility]-EMERGENCY DEPT Provider Note   CSN: [id number] Arrival date & time: 08/14/18  1409     History   Chief Complaint Chief Complaint  Patient presents with  . Abdominal Pain    HPI Belinda Long is a 22 y.o. female.  HPI  22 year old female G0 LMP August 10 normal time presents today with right lower quadrant pain.  She states that she has some low back pain to flank pain that began on Saturday and resolved by waking up on Sunday morning.  However, on Sunday she continued to have some fatigue immobilized.  States that she has had some pain when she urinates but does not have any increased frequency of urination or external pain with urination.  Was seen at her primary care office today and had a urinalysis obtained that they did not think look infected.  However, due to right lower quadrant pain she was sent here for further evaluation.  She has not had nausea, vomiting, fever, or chills.  Does have a history of DVT in the past but is not on birth control pills any longer and is not anticoagulated any longer.  She denies sexual activity currently or in the past.  She has not had any abnormal vaginal discharge.  Past Medical History:  Diagnosis Date  . 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 DVT, lower extremity, proximal, acute (HCC) 06/07/2012   left - was on OCP- Yaz  . Dysmenorrhea in the adolescent   . Factor V deficiency [facility])     Patient Active Problem List   Diagnosis Date Noted  . Perleche 03/21/2013  . 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 Adjustment disorder with depressed mood 09/19/2012  . Body image problem 09/19/2012  . Anemia 08/14/2012  . DVT (deep venous thrombosis) (HCC) 06/22/2012  . Chronic anticoagulation 06/22/2012  . Encounter for monitoring Coumadin therapy 06/12/2012  . Heterozygous factor V Leiden mutation (HCC) 06/12/2012  . Wears glasses  07/06/2011  . Left wrist injury 07/06/2011  . Well adolescent visit 07/06/2011  . PRIMARY DYSMENORRHEA 08/10/2010  . KNEE PAIN, BILATERAL 08/10/2010  . ALLERGIC RHINITIS 03/19/2010  . 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 TONSILLECTOMY       OB History    Gravida  0   Para      Term      Preterm      AB      Living        SAB      TAB      Ectopic      Multiple      Live Births               Home Medications    Prior to Admission medications   Medication Sig Start Date End Date Taking? Authorizing Provider  ampicillin (PRINCIPEN) 500 MG capsule  12/04/16   [provider]  spironolactone (ALDACTONE) 25 MG tablet  12/16/16   [provider]  VYVANSE 50 MG capsule  11/11/15   [provider]    Family History Family History  Problem Relation Age of Onset  . Cancer Maternal Grandmother        myeloma  . Hyperlipidemia Mother   . Depression Mother   . Factor V Leiden deficiency Father   . Prostate cancer Paternal Grandfather  Social History Social History   Tobacco Use  . Smoking status: Never Smoker  . Smokeless tobacco: Never Used  Substance Use Topics  . Alcohol use: No    Alcohol/week: 0.0 standard drinks  . Drug use: No     Allergies   Patient has no known allergies.   Review of Systems Review of Systems  All other systems reviewed and are negative.    Physical Exam Updated Vital Signs BP 116/68   Pulse 63   Temp 98.5 F (36.9 C) (Oral)   Resp 16   Ht 1.702 m (5\' 7" )   Wt 63 kg   LMP 07/21/2018   SpO2 100%   BMI 21.77 kg/m   Physical Exam  Constitutional: She is oriented to person, place, and time. She appears well-developed and well-nourished.  HENT:  Head: Normocephalic and atraumatic.  Mouth/Throat: Oropharynx is clear and moist.  Eyes: Pupils are equal, round, and reactive to light. EOM are normal.  Cardiovascular: Normal rate, regular rhythm  and normal heart sounds.  Pulmonary/Chest: Effort normal.  Abdominal: Soft. Normal appearance and bowel sounds are normal. There is tenderness in the right lower quadrant. There is no rigidity, no rebound, no guarding and no CVA tenderness.  Neurological: She is alert and oriented to person, place, and time.  Skin: Skin is warm. Capillary refill takes less than 2 seconds.  Psychiatric: She has a normal mood and affect.  Nursing note and vitals reviewed.    ED Treatments / Results  Labs (all labs ordered are listed, but only abnormal results are displayed) Labs Reviewed  LIPASE, BLOOD  COMPREHENSIVE METABOLIC PANEL  CBC  URINALYSIS, ROUTINE W REFLEX MICROSCOPIC  I-STAT BETA HCG BLOOD, ED ([facility], [facility], [facility] ONLY)  I-STAT BETA HCG BLOOD, ED ([facility], [facility], [facility] ONLY)    EKG None  Radiology No results found.  Procedures Procedures (including critical care time)  Medications Ordered in ED Medications  sodium chloride 0.9 % bolus 1,000 mL (1,000 mLs Intravenous New Bag/Given 08/14/18 1509)  ketorolac (TORADOL) 30 MG/ML injection 30 mg (30 mg Intravenous Given 08/14/18 1509)     Initial Impression / Assessment and Plan / ED Course  I have reviewed the triage vital signs and the nursing notes.  Pertinent labs & imaging results that were available during my care of the patient were reviewed by me and considered in my medical decision making (see chart for details).  Clinical Course as of Aug 16 918  Tue Aug 14, 2018  2052 Ordered  Urine culture [EW]  2052 Negative  I-Stat beta hCG blood, ED [EW]  2052 Normal except mild elevation of AST and ALT  Comprehensive metabolic panel(!) [EW]  2052 Normal except blood and bacteria present, urine culture ordered  Urinalysis, Routine w reflex microscopic(!) [EW]  2053 Normal  Lipase, blood [EW]  2053 Normal except white count elevated, hemoglobin elevated  CBC(!) [EW]  2054 I discussed the case with Dr. Catalina Antigua, on-call gynecologist.  She  recommends symptomatic treatment with ibuprofen, heat to affected area and office follow-up in 2 weeks.  Her office will call the patient for a follow-up appointment.   [EW]    Clinical Course User Index [EW] Mancel Bale, MD    22 year old female presents today with right-sided back pain and right lower quadrant pain.  Urinalysis is clear.  CT is significant for a complex right adnexal mass.  Ultrasound is ordered with Doppler flow to assess for torsion.  Discussed results of the CT with the  patient and her mother.  Patient is signed out to Dr. Effie Shy.  Patient disposition pending results of ultrasound. Final Clinical Impressions(s) / ED Diagnoses   Final diagnoses:  RLQ abdominal pain  Right tubo-ovarian mass    ED Discharge Orders    None       Margarita Grizzle, MD 08/14/18 1615    Margarita Grizzle, MD 08/16/18 [phone]

## 2018-08-14 NOTE — Discharge Instructions (Addendum)
The testing shows a hemorrhagic cyst of the right ovary.  These can resolve spontaneously, without treatment.  They can be uncomfortable for a time.  It is important to rest, and avoid heavy activities, to lessen problems.  Try using a heating pad on the sore area 3 or 4 times a day.  Also try using ibuprofen 400 mg every 6 hours for pain.  We are giving you a prescription for narcotic pain reliever, but do not use it when you are driving or working.  If you experience complications or have other concerns, go to the maternity admissions unit at [facility]

## 2018-08-14 NOTE — Addendum Note (Signed)
Addended by: Nancy Fetter on: 08/14/2018 04:24 PM   Modules accepted: Orders

## 2018-08-16 ENCOUNTER — Ambulatory Visit (INDEPENDENT_AMBULATORY_CARE_PROVIDER_SITE_OTHER): Payer: 59 | Admitting: Obstetrics and Gynecology

## 2018-08-16 ENCOUNTER — Other Ambulatory Visit: Payer: Self-pay

## 2018-08-16 ENCOUNTER — Encounter: Payer: Self-pay | Admitting: Obstetrics and Gynecology

## 2018-08-16 VITALS — BP 116/68 | HR 68 | Resp 14 | Ht 66.5 in | Wt 139.0 lb

## 2018-08-16 DIAGNOSIS — N83201 Unspecified ovarian cyst, right side: Secondary | ICD-10-CM | POA: Diagnosis not present

## 2018-08-16 LAB — URINE CULTURE

## 2018-08-16 NOTE — Progress Notes (Signed)
GYNECOLOGY  VISIT   HPI: 22 y.o.   Single  Caucasian  female   G0P0 with Patient's last menstrual period was 07/21/2018.   Here as an old-new patient for right ovarian cyst. Patient has seen Dr. Edward Jolly in the past. Patient referred here from [facility]     Saw her PCP 2 days ago for excruciating right sided lower abdominal pain.  Some pain on her left side.  Had back pain 5 nights ago and had chills.  Woke up in a sweat.  Felt flu like.  Diarrhea yesterday and this resolved.  Some nausea yesterday, and less so today.   Had a CT scan at [facility] showing a cyst of her right ovary.  Right ovary measuring 7.8 x 5.2 x 6.6 cm.   Contains complex cystic mass 6.8 x 4.8 x 5.9 cm with blood flow to rim of ovarian tissue around the mass. No blood flow in the mass.  Looks like hemorrhagic ovarian cyst. Left ovary normal.  Normal uterus with no masses and EMS 8.6 cm.  Norma appendix and no renal calculi.   Quant hCG was negative.  Has not taken hydrocodone.  Taking Advil 400 mg every 4 - 6 hours and using heating pad.  Able to sleep, eat, and go to school. Pain significantly improved.  Menses regular.   Recent labral tear in left hip.  Able to exercise.  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   GYNECOLOGIC HISTORY: Patient's last menstrual period was 07/21/2018. Contraception:  Condoms Menopausal hormone therapy:  n/a Last mammogram:  n/a Last pap smear:   never        OB History    Gravida  0   Para      Term      Preterm      AB      Living        SAB      TAB      Ectopic      Multiple      Live Births                 Patient Active Problem List   Diagnosis Date Noted  . Perleche 03/21/2013  . 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 Adjustment disorder with depressed mood 09/19/2012  . Body image problem 09/19/2012  . Anemia 08/14/2012  . DVT (deep venous thrombosis) (HCC) 06/22/2012  . Chronic anticoagulation 06/22/2012   . Encounter for monitoring Coumadin therapy 06/12/2012  . Heterozygous factor V Leiden mutation (HCC) 06/12/2012  . Wears glasses 07/06/2011  . Left wrist injury 07/06/2011  . Well adolescent visit 07/06/2011  . PRIMARY DYSMENORRHEA 08/10/2010  . KNEE PAIN, BILATERAL 08/10/2010  . ALLERGIC RHINITIS 03/19/2010  . 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 Anemia   . Depression   . DVT, lower extremity, proximal, acute (HCC) 06/07/2012   left - was on OCP- Yaz  . Dysmenorrhea in the adolescent   . Factor V deficiency (HCC)   . Ovarian cyst     Past Surgical History:  Procedure Laterality Date  . TONSILLECTOMY      Current Outpatient Medications  Medication Sig Dispense Refill  . ampicillin (PRINCIPEN) 500 MG capsule Take 500 mg by mouth daily.     . Ibuprofen (ADVIL PO) Take 400 mg by  mouth as needed.    . Multiple Vitamins-Minerals (MULTIVITAMIN ADULT PO) Take 1 tablet by mouth daily.    Marland Kitchen spironolactone (ALDACTONE) 25 MG tablet Take 25 mg by mouth daily.     Marland Kitchen VYVANSE 50 MG capsule Take 50 mg by mouth daily.     Marland Kitchen HYDROcodone-acetaminophen (NORCO/VICODIN) 5-325 MG tablet Take 1 tablet by mouth every 4 (four) hours as needed for moderate pain. (Patient not taking: Reported on 08/16/2018) 15 tablet 0   No current facility-administered medications for this visit.      ALLERGIES: Patient has no known allergies.  Family History  Problem Relation Age of Onset  . Cancer Maternal Grandmother        myeloma  . Breast cancer Maternal Grandmother   . Bone cancer Maternal Grandmother   . Hyperlipidemia Mother   . Depression Mother   . Factor V Leiden deficiency Father   . Melanoma Father   . Prostate cancer Paternal Grandfather   . Stomach cancer Paternal Grandfather     Social History   Socioeconomic History  . Marital status: Single    Spouse name: Not on  file  . Number of children: 0  . Years of education: Not on file  . Highest education level: Not on file  Occupational History  . Not on file  Social Needs  . Financial resource strain: Not on file  . Food insecurity:    Worry: Not on file    Inability: Not on file  . Transportation needs:    Medical: Not on file    Non-medical: Not on file  Tobacco Use  . Smoking status: Never Smoker  . Smokeless tobacco: Never Used  Substance and Sexual Activity  . Alcohol use: No    Alcohol/week: 0.0 standard drinks  . Drug use: Never  . Sexual activity: Yes    Partners: Male    Birth control/protection: Condom  Lifestyle  . Physical activity:    Days per week: Not on file    Minutes per session: Not on file  . Stress: Not on file  Relationships  . Social connections:    Talks on phone: Not on file    Gets together: Not on file    Attends religious service: Not on file    Active member of club or organization: Not on file    Attends meetings of clubs or organizations: Not on file    Relationship status: Not on file  . Intimate partner violence:    Fear of current or ex partner: Not on file    Emotionally abused: Not on file    Physically abused: Not on file    Forced sexual activity: Not on file  Other Topics Concern  . Not on file  Social History Narrative   Plays basketball and volleyball   [employer] going into 12th grade grades are good   HH of 3   3 dogs    Review of Systems  Constitutional: Positive for appetite change.  All other systems reviewed and are negative.   PHYSICAL EXAMINATION:    BP 116/68 (BP Location: Right Arm, Patient Position: Sitting, Cuff Size: Normal)   Pulse 68   Resp 14   Ht 5' 6.5" (1.689 m)   Wt 139 lb (63 kg)   LMP 07/21/2018 Comment: NEG BETA HCG 08/14/18  BMI 22.10 kg/m     General appearance: alert, cooperative and appears stated age Head: Normocephalic, without obvious abnormality, atraumatic Neck: no adenopathy, supple,  symmetrical,  trachea midline and thyroid normal to inspection and palpation Lungs: clear to auscultation bilaterally  Heart: regular rate and rhythm Abdomen: soft, non-tender, no masses,  no organomegaly Extremities: extremities normal, atraumatic, no cyanosis or edema Skin: Skin color, texture, turgor normal. No rashes or lesions No abnormal inguinal nodes palpated Neurologic: Grossly normal  Pelvic: External genitalia:  no lesions              Urethra:  normal appearing urethra with no masses, tenderness or lesions              Bartholins and Skenes: normal                 Vagina: normal appearing vagina with normal color and discharge, no lesions              Cervix: no lesions                Bimanual Exam:  Uterus:  normal size, contour, position, consistency, mobility, non-tender              Adnexa: no mass, fullness, tenderness.  Soft fullness of right adnexa - nontender.              Rectal exam: Yes.  .  Confirms.              Anus:  normal sphincter tone, no lesions  Chaperone was present for exam.  ASSESSMENT  Large hemorrhagic right ovarian cyst.  No acute abdomen.  Hx Factor V Leiden.  Hx DVT.   PLAN  We discussed ovarian cysts and management.  I am recommending a follow up pelvic US in 6 weeks.  Torsion risk discussed.  Pelvic rest.  Also needs a well woman visit to update her care.   An After Visit Summary was printed and given to the patient.  __30____ minutes face to face time of which over 50% was spent in counseling.

## 2018-08-16 NOTE — Patient Instructions (Signed)
Ovarian Cyst An ovarian cyst is a fluid-filled sac that forms on an ovary. The ovaries are small organs that produce eggs in women. Various types of cysts can form on the ovaries. Some may cause symptoms and require treatment. Most ovarian cysts go away on their own, are not cancerous (are benign), and do not cause problems. Common types of ovarian cysts include:  Functional (follicle) cysts. ? Occur during the menstrual cycle, and usually go away with the next menstrual cycle if you do not get pregnant. ? Usually cause no symptoms.  Endometriomas. ? Are cysts that form from the tissue that lines the uterus (endometrium). ? Are sometimes called "chocolate cysts" because they become filled with blood that turns brown. ? Can cause pain in the lower abdomen during intercourse and during your period.  Cystadenoma cysts. ? Develop from cells on the outside surface of the ovary. ? Can get very large and cause lower abdomen pain and pain with intercourse. ? Can cause severe pain if they twist or break open (rupture).  Dermoid cysts. ? Are sometimes found in both ovaries. ? May contain different kinds of body tissue, such as skin, teeth, hair, or cartilage. ? Usually do not cause symptoms unless they get very big.  Theca lutein cysts. ? Occur when too much of a certain hormone (human chorionic gonadotropin) is produced and overstimulates the ovaries to produce an egg. ? Are most common after having procedures used to assist with the conception of a baby (in vitro fertilization).  What are the causes? Ovarian cysts may be caused by:  Ovarian hyperstimulation syndrome. This is a condition that can develop from taking fertility medicines. It causes multiple large ovarian cysts to form.  Polycystic ovarian syndrome (PCOS). This is a common hormonal disorder that can cause ovarian cysts, as well as problems with your period or fertility.  What increases the risk? The following factors may make  you more likely to develop ovarian cysts:  Being overweight or obese.  Taking fertility medicines.  Taking certain forms of hormonal birth control.  Smoking.  What are the signs or symptoms? Many ovarian cysts do not cause symptoms. If symptoms are present, they may include:  Pelvic pain or pressure.  Pain in the lower abdomen.  Pain during sex.  Abdominal swelling.  Abnormal menstrual periods.  Increasing pain with menstrual periods.  How is this diagnosed? These cysts are commonly found during a routine pelvic exam. You may have tests to find out more about the cyst, such as:  Ultrasound.  X-ray of the pelvis.  CT scan.  MRI.  Blood tests.  How is this treated? Many ovarian cysts go away on their own without treatment. Your health care provider may want to check your cyst regularly for 2-3 months to see if it changes. If you are in menopause, it is especially important to have your cyst monitored closely because menopausal women have a higher rate of ovarian cancer. When treatment is needed, it may include:  Medicines to help relieve pain.  A procedure to drain the cyst (aspiration).  Surgery to remove the whole cyst.  Hormone treatment or birth control pills. These methods are sometimes used to help dissolve a cyst.  Follow these instructions at home:  Take over-the-counter and prescription medicines only as told by your health care provider.  Do not drive or use heavy machinery while taking prescription pain medicine.  Get regular pelvic exams and Pap tests as often as told by your health care   provider.  Return to your normal activities as told by your health care provider. Ask your health care provider what activities are safe for you.  Do not use any products that contain nicotine or tobacco, such as cigarettes and e-cigarettes. If you need help quitting, ask your health care provider.  Keep all follow-up visits as told by your health care provider.  This is important. Contact a health care provider if:  Your periods are late, irregular, or painful, or they stop.  You have pelvic pain that does not go away.  You have pressure on your bladder or trouble emptying your bladder completely.  You have pain during sex.  You have any of the following in your abdomen: ? A feeling of fullness. ? Pressure. ? Discomfort. ? Pain that does not go away. ? Swelling.  You feel generally ill.  You become constipated.  You lose your appetite.  You develop severe acne.  You start to have more body hair and facial hair.  You are gaining weight or losing weight without changing your exercise and eating habits.  You think you may be pregnant. Get help right away if:  You have abdominal pain that is severe or gets worse.  You cannot eat or drink without vomiting.  You suddenly develop a fever.  Your menstrual period is much heavier than usual. This information is not intended to replace advice given to you by your health care provider. Make sure you discuss any questions you have with your health care provider. Document Released: 11/28/2005 Document Revised: 06/17/2016 Document Reviewed: 05/01/2016 Elsevier Interactive Patient Education  2018 Elsevier Inc.  

## 2018-08-17 ENCOUNTER — Telehealth: Payer: Self-pay | Admitting: Obstetrics and Gynecology

## 2018-08-17 NOTE — Telephone Encounter (Signed)
Call placed to convey benefits. 

## 2018-08-22 NOTE — Telephone Encounter (Signed)
Call placed to patient to review benefits for a recommended ultrasound. Left voicemail message requesting a return call. °

## 2018-08-23 NOTE — Telephone Encounter (Signed)
Call placed to patient to review benefits for a recommended ultrasound. Left voicemail message requesting a return call. °

## 2018-08-27 NOTE — Telephone Encounter (Signed)
Call placed to patient. Per DPR, ok to leave a detailed message on her cell phone voicemail. Left message requesting patient to call our office in regards to appointment scheduled for her on 09/27/18. This appointment should be for an ultrasound and consultation, the appointment scheduled incorrectly. Upon return call, will need to convey benefits and schedule recommended ultrasound/appointment with Dr Edward JollySilva.

## 2018-08-30 NOTE — Progress Notes (Deleted)
22 y.o. G0P0 Single Caucasian female here for annual exam.    PCP:     No LMP recorded.           Sexually active: {yes no:314532}  The current method of family planning is {contraception:315051}.    Exercising: {yes no:314532}  {types:19826} Smoker:  {YES NO:22349}  Health Maintenance: Pap:  never History of abnormal Pap:  no MMG:  n/a Colonoscopy:  n/a BMD:   n/a  Result  n/a TDaP:  03/07/2017 Gardasil:   {YES ZO:10960}O:22349} HIV: Hep C: Screening Labs:  Hb today: ***, Urine today: ***   reports that she has never smoked. She has never used smokeless tobacco. She reports that she does not drink alcohol or use drugs.  Past Medical History:  Diagnosis Date  . 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 Anemia   . Depression   . DVT, lower extremity, proximal, acute (HCC) 06/07/2012   left - was on OCP- Yaz  . Dysmenorrhea in the adolescent   . Factor V deficiency (HCC)   . Ovarian cyst     Past Surgical History:  Procedure Laterality Date  . TONSILLECTOMY      Current Outpatient Medications  Medication Sig Dispense Refill  . ampicillin (PRINCIPEN) 500 MG capsule Take 500 mg by mouth daily.     Marland Kitchen. HYDROcodone-acetaminophen (NORCO/VICODIN) 5-325 MG tablet Take 1 tablet by mouth every 4 (four) hours as needed for moderate pain. (Patient not taking: Reported on 08/16/2018) 15 tablet 0  . Ibuprofen (ADVIL PO) Take 400 mg by mouth as needed.    . Multiple Vitamins-Minerals (MULTIVITAMIN ADULT PO) Take 1 tablet by mouth daily.    Marland Kitchen. spironolactone (ALDACTONE) 25 MG tablet Take 25 mg by mouth daily.     Marland Kitchen. VYVANSE 50 MG capsule Take 50 mg by mouth daily.      No current facility-administered medications for this visit.     Family History  Problem Relation Age of Onset  . Cancer Maternal Grandmother        myeloma  . Breast cancer Maternal Grandmother   . Bone cancer Maternal Grandmother   . Hyperlipidemia Mother   .  Depression Mother   . Factor V Leiden deficiency Father   . Melanoma Father   . Prostate cancer Paternal Grandfather   . Stomach cancer Paternal Grandfather     Review of Systems  Constitutional: Negative.   HENT: Negative.   Eyes: Negative.   Respiratory: Negative.   Cardiovascular: Negative.   Gastrointestinal: Negative.   Endocrine: Negative.   Genitourinary: Negative.   Musculoskeletal: Negative.   Skin: Negative.   Allergic/Immunologic: Negative.   Neurological: Negative.   Hematological: Negative.   Psychiatric/Behavioral: Negative.   All other systems reviewed and are negative.   Exam:   There were no vitals taken for this visit.    General appearance: alert, cooperative and appears stated age Head: Normocephalic, without obvious abnormality, atraumatic Neck: no adenopathy, supple, symmetrical, trachea midline and thyroid normal to inspection and palpation Lungs: clear to auscultation bilaterally Breasts: normal appearance, no masses or tenderness, No nipple retraction or dimpling, No nipple discharge or bleeding, No axillary or supraclavicular adenopathy Heart: regular rate and rhythm Abdomen: soft, non-tender; no masses, no organomegaly Extremities: extremities normal, atraumatic, no cyanosis or edema Skin: Skin color, texture, turgor normal. No rashes or lesions Lymph nodes: Cervical, supraclavicular, and axillary nodes normal. No abnormal inguinal nodes  palpated Neurologic: Grossly normal  Pelvic: External genitalia:  no lesions              Urethra:  normal appearing urethra with no masses, tenderness or lesions              Bartholins and Skenes: normal                 Vagina: normal appearing vagina with normal color and discharge, no lesions              Cervix: no lesions              Pap taken: {yes no:314532} Bimanual Exam:  Uterus:  normal size, contour, position, consistency, mobility, non-tender              Adnexa: no mass, fullness, tenderness               Rectal exam: {yes no:314532}.  Confirms.              Anus:  normal sphincter tone, no lesions  Chaperone was present for exam.  Assessment:   Well woman visit with normal exam.   Plan: Mammogram screening. Recommended self breast awareness. Pap and HR HPV as above. Guidelines for Calcium, Vitamin D, regular exercise program including cardiovascular and weight bearing exercise.   Follow up annually and prn.   Additional counseling given.  {yes [id number]. _______ minutes face to face time of which over 50% was spent in counseling.    After visit summary provided.

## 2018-08-30 NOTE — Telephone Encounter (Signed)
Spoke with patient regarding benefit for an ultrasound. Patient understood and agreeable. Patient ready to schedule. Patient scheduled 09/27/18 with Dr Edward JollySilva. Patient aware of appointment date, arrival time and cancellation policy. No further questions. Ok to close

## 2018-08-31 ENCOUNTER — Encounter: Payer: Self-pay | Admitting: Obstetrics and Gynecology

## 2018-08-31 ENCOUNTER — Ambulatory Visit: Payer: 59 | Admitting: Obstetrics and Gynecology

## 2018-09-27 ENCOUNTER — Ambulatory Visit: Payer: 59 | Admitting: Obstetrics and Gynecology

## 2018-09-27 ENCOUNTER — Ambulatory Visit (INDEPENDENT_AMBULATORY_CARE_PROVIDER_SITE_OTHER): Payer: 59 | Admitting: Obstetrics and Gynecology

## 2018-09-27 ENCOUNTER — Ambulatory Visit (INDEPENDENT_AMBULATORY_CARE_PROVIDER_SITE_OTHER): Payer: 59

## 2018-09-27 ENCOUNTER — Encounter: Payer: Self-pay | Admitting: Obstetrics and Gynecology

## 2018-09-27 ENCOUNTER — Other Ambulatory Visit: Payer: Self-pay

## 2018-09-27 VITALS — BP 98/60 | HR 60 | Ht 66.5 in | Wt 141.0 lb

## 2018-09-27 DIAGNOSIS — N83201 Unspecified ovarian cyst, right side: Secondary | ICD-10-CM | POA: Diagnosis not present

## 2018-09-27 NOTE — Progress Notes (Signed)
GYNECOLOGY  VISIT   HPI: 22 y.o.   Single  Caucasian  female   G0P0 with Patient's last menstrual period was 09/20/2018 (exact date).   here for pelvic ultrasound for recheck of a hemorrhagic right ovarian cyst 7.8 x 5.2 x 6.6 cm noted on pelvic US at [facility] 08/14/18 when seen for right pelvic pain.   Last menses was heavy but not unusually so.  No pelvic pain, but has low back pain when her cycle is starting.   GYNECOLOGIC HISTORY: Patient's last menstrual period was 09/20/2018 (exact date). Contraception:  condoms Menopausal hormone therapy:  n/a Last mammogram:  n/a Last pap smear:  never        OB History    Gravida  0   Para      Term      Preterm      AB      Living        SAB      TAB      Ectopic      Multiple      Live Births                 Patient Active Problem List   Diagnosis Date Noted  . Perleche 03/21/2013  . 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 Adjustment disorder with depressed mood 09/19/2012  . Body image problem 09/19/2012  . Anemia 08/14/2012  . DVT (deep venous thrombosis) (HCC) 06/22/2012  . Chronic anticoagulation 06/22/2012  . Encounter for monitoring Coumadin therapy 06/12/2012  . Heterozygous factor V Leiden mutation (HCC) 06/12/2012  . Wears glasses 07/06/2011  . Left wrist injury 07/06/2011  . Well adolescent visit 07/06/2011  . PRIMARY DYSMENORRHEA 08/10/2010  . KNEE PAIN, BILATERAL 08/10/2010  . ALLERGIC RHINITIS 03/19/2010  . 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 Anemia   . Depression   . DVT, lower extremity, proximal, acute (HCC) 06/07/2012   left - was on OCP- Yaz  . Dysmenorrhea in the adolescent   . Factor V deficiency (HCC)   . Ovarian cyst     Past Surgical History:  Procedure Laterality Date  . TONSILLECTOMY      Current Outpatient Medications  Medication Sig  Dispense Refill  . ampicillin (PRINCIPEN) 500 MG capsule Take 500 mg by mouth daily.     . Multiple Vitamins-Minerals (MULTIVITAMIN ADULT PO) Take 1 tablet by mouth daily.    Marland Kitchen spironolactone (ALDACTONE) 25 MG tablet Take 25 mg by mouth daily.     Marland Kitchen VYVANSE 50 MG capsule Take 50 mg by mouth daily.      No current facility-administered medications for this visit.      ALLERGIES: Patient has no known allergies.  Family History  Problem Relation Age of Onset  . Cancer Maternal Grandmother        myeloma  . Breast cancer Maternal Grandmother   . Bone cancer Maternal Grandmother   . Hyperlipidemia Mother   . Depression Mother   . Factor V Leiden deficiency Father   . Melanoma Father   . Prostate cancer Paternal Grandfather   . Stomach cancer Paternal Grandfather     Social History   Socioeconomic History  . Marital status: Single    Spouse name: Not on file  . Number of children: 0  . Years of education: Not  on file  . Highest education level: Not on file  Occupational History  . Not on file  Social Needs  . Financial resource strain: Not on file  . Food insecurity:    Worry: Not on file    Inability: Not on file  . Transportation needs:    Medical: Not on file    Non-medical: Not on file  Tobacco Use  . Smoking status: Never Smoker  . Smokeless tobacco: Never Used  Substance and Sexual Activity  . Alcohol use: No    Alcohol/week: 0.0 standard drinks  . Drug use: Never  . Sexual activity: Yes    Partners: Male    Birth control/protection: Condom  Lifestyle  . Physical activity:    Days per week: Not on file    Minutes per session: Not on file  . Stress: Not on file  Relationships  . Social connections:    Talks on phone: Not on file    Gets together: Not on file    Attends religious service: Not on file    Active member of club or organization: Not on file    Attends meetings of clubs or organizations: Not on file    Relationship status: Not on file  .  Intimate partner violence:    Fear of current or ex partner: Not on file    Emotionally abused: Not on file    Physically abused: Not on file    Forced sexual activity: Not on file  Other Topics Concern  . Not on file  Social History Narrative   Plays basketball and volleyball   [employer] going into 12th grade grades are good   HH of 3   3 dogs    Review of Systems  All other systems reviewed and are negative.   PHYSICAL EXAMINATION:    BP 98/60 (BP Location: Right Arm, Patient Position: Sitting, Cuff Size: Normal)   Pulse 60   Ht 5' 6.5" (1.689 m)   Wt 141 lb (64 kg)   LMP 09/20/2018 (Exact Date)   BMI 22.42 kg/m     General appearance: alert, cooperative and appears stated age  Pelvic US Uterus no masses.  EMS 1.87 mm. Normal ovaries.   Ovarian cyst resolved.  No free fluid.  ASSESSMENT  Right ovarian cyst resolved.   PLAN  Discussion of functional ovarian cysts.  Reassurance given.  Return for annual exam.    An After Visit Summary was printed and given to the patient.  ___10___ minutes face to face time of which over 50% was spent in counseling.

## 2018-09-27 NOTE — Progress Notes (Signed)
Encounter reviewed by Dr. Chynah Orihuela Amundson C. Silva.  

## 2019-11-18 ENCOUNTER — Ambulatory Visit: Payer: 59 | Admitting: Obstetrics and Gynecology

## 2019-11-18 ENCOUNTER — Encounter: Payer: Self-pay | Admitting: Obstetrics and Gynecology

## 2019-11-18 NOTE — Progress Notes (Deleted)
23 y.o. G0P0 Single Caucasian female here for annual exam.    PCP:     No LMP recorded.           Sexually active: {yes no:314532}  The current method of family planning is {contraception:315051}.    Exercising: {yes no:314532}  {types:19826} Smoker:  {YES NO:22349}  Health Maintenance: Pap:  Never History of abnormal Pap:  n/a MMG:  n/a Colonoscopy:  n/a BMD:   n/a  Result  n/a TDaP:  03-07-17 Gardasil:   yes HIV:*** Hep C:*** Screening Labs:  Hb today: ***, Urine today: ***   reports that she has never smoked. She has never used smokeless tobacco. She reports that she does not drink alcohol or use drugs.  Past Medical History:  Diagnosis Date  . 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 Anemia   . Depression   . DVT, lower extremity, proximal, acute ([place]) 06/07/2012   left - was on OCP- [facility]  . Dysmenorrhea in the adolescent   . Factor V deficiency ([place])   . Ovarian cyst     Past Surgical History:  Procedure Laterality Date  . TONSILLECTOMY      Current Outpatient Medications  Medication Sig Dispense Refill  . ampicillin (PRINCIPEN) 500 MG capsule Take 500 mg by mouth daily.     . Multiple Vitamins-Minerals (MULTIVITAMIN ADULT PO) Take 1 tablet by mouth daily.    Marland Kitchen spironolactone (ALDACTONE) 25 MG tablet Take 25 mg by mouth daily.     Marland Kitchen VYVANSE 50 MG capsule Take 50 mg by mouth daily.      No current facility-administered medications for this visit.     Family History  Problem Relation Age of Onset  . Cancer Maternal Grandmother        myeloma  . Breast cancer Maternal Grandmother   . Bone cancer Maternal Grandmother   . Hyperlipidemia Mother   . Depression Mother   . Factor V Leiden deficiency Father   . Melanoma Father   . Prostate cancer Paternal Grandfather   . Stomach cancer Paternal Grandfather     Review of Systems  Exam:   There were no vitals taken for this visit.    General appearance:  alert, cooperative and appears stated age Head: normocephalic, without obvious abnormality, atraumatic Neck: no adenopathy, supple, symmetrical, trachea midline and thyroid normal to inspection and palpation Lungs: clear to auscultation bilaterally Breasts: normal appearance, no masses or tenderness, No nipple retraction or dimpling, No nipple discharge or bleeding, No axillary adenopathy Heart: regular rate and rhythm Abdomen: soft, non-tender; no masses, no organomegaly Extremities: extremities normal, atraumatic, no cyanosis or edema Skin: skin color, texture, turgor normal. No rashes or lesions Lymph nodes: cervical, supraclavicular, and axillary nodes normal. Neurologic: grossly normal  Pelvic: External genitalia:  no lesions              No abnormal inguinal nodes palpated.              Urethra:  normal appearing urethra with no masses, tenderness or lesions              Bartholins and Skenes: normal                 Vagina: normal appearing vagina with normal color and discharge, no lesions              Cervix: no lesions  Pap taken: {yes no:314532} Bimanual Exam:  Uterus:  normal size, contour, position, consistency, mobility, non-tender              Adnexa: no mass, fullness, tenderness              Rectal exam: {yes no:314532}.  Confirms.              Anus:  normal sphincter tone, no lesions  Chaperone was present for exam.  Assessment:   Well woman visit with normal exam.   Plan: Mammogram screening discussed. Self breast awareness reviewed. Pap and HR HPV as above. Guidelines for Calcium, Vitamin D, regular exercise program including cardiovascular and weight bearing exercise.   Follow up annually and prn.   Additional counseling given.  {yes [id number]. _______ minutes face to face time of which over 50% was spent in counseling.    After visit summary provided.
